# Patient Record
Sex: Male | Born: 1970 | Race: Black or African American | Hispanic: No | Smoking: Never smoker
Health system: Southern US, Community
[De-identification: ages and names within clinical notes are randomized; demographics above are authoritative.]

## PROBLEM LIST (undated history)

## (undated) DIAGNOSIS — G4730 Sleep apnea, unspecified: Secondary | ICD-10-CM

## (undated) DIAGNOSIS — R6250 Unspecified lack of expected normal physiological development in childhood: Secondary | ICD-10-CM

## (undated) DIAGNOSIS — R569 Unspecified convulsions: Secondary | ICD-10-CM

## (undated) DIAGNOSIS — M1710 Unilateral primary osteoarthritis, unspecified knee: Secondary | ICD-10-CM

## (undated) DIAGNOSIS — I10 Essential (primary) hypertension: Secondary | ICD-10-CM

## (undated) DIAGNOSIS — M179 Osteoarthritis of knee, unspecified: Secondary | ICD-10-CM

## (undated) HISTORY — DX: Essential (primary) hypertension: I10

## (undated) HISTORY — DX: Unspecified convulsions: R56.9

---

## 1999-11-03 HISTORY — PX: KNEE ARTHROSCOPY W/ DEBRIDEMENT: SHX1867

## 2001-02-10 ENCOUNTER — Encounter (HOSPITAL_COMMUNITY): Admission: RE | Admit: 2001-02-10 | Discharge: 2001-03-12 | Payer: Self-pay | Admitting: Rheumatology

## 2015-02-25 ENCOUNTER — Other Ambulatory Visit (HOSPITAL_COMMUNITY): Payer: Self-pay | Admitting: Orthopaedic Surgery

## 2015-03-05 NOTE — Pre-Procedure Instructions (Signed)
Johnny BolusMorris F Hayes  03/05/2015   Your procedure is scheduled on:    Wednesday 03/20/15  [facility] at [facility]30 AM.  Call this number if you have problems the morning of surgery: [phone]   Remember:   Do not eat food or drink liquids after midnight.   Take these medicines the morning of surgery with A SIP OF WATER: tylenol   Do not wear jewelry.  Do not wear lotions, powders, orcolognes. You may wear deodorant.  Men may shave face and neck.  Do not bring valuables to the hospital.  [facility] not [facility]  for any belongings or valuables.               Contacts, dentures or bridgework may not be worn into surgery.  Leave suitcase in the car. After surgery it may be brought to your room.  For patients admitted to the hospital, discharge time is determined by your treatment team.               Patients discharged the day of surgery will not be allowed to drive home.  Name and phone number of your driver: family/friend   Special Instructions: [place] - Preparing for Surgery   Please read over the following fact sheets that you were given: Pain Booklet, Coughing and Deep Breathing, MRSA Information and Surgical Site Infection Prevention

## 2015-03-06 ENCOUNTER — Ambulatory Visit (HOSPITAL_COMMUNITY)
Admission: RE | Admit: 2015-03-06 | Discharge: 2015-03-06 | Disposition: A | Discharge status: Home / Self Care | Payer: Medicare Other | Source: Ambulatory Visit | Attending: Orthopaedic Surgery | Admitting: Orthopaedic Surgery

## 2015-03-06 ENCOUNTER — Encounter (HOSPITAL_COMMUNITY): Payer: Self-pay

## 2015-03-06 ENCOUNTER — Encounter (HOSPITAL_COMMUNITY)
Admission: RE | Admit: 2015-03-06 | Discharge: 2015-03-06 | Disposition: A | Discharge status: Home / Self Care | Payer: Medicare Other | Source: Ambulatory Visit | Attending: Orthopaedic Surgery | Admitting: Orthopaedic Surgery

## 2015-03-06 DIAGNOSIS — I4510 Unspecified right bundle-branch block: Secondary | ICD-10-CM | POA: Insufficient documentation

## 2015-03-06 DIAGNOSIS — Z01812 Encounter for preprocedural laboratory examination: Secondary | ICD-10-CM | POA: Insufficient documentation

## 2015-03-06 DIAGNOSIS — G4733 Obstructive sleep apnea (adult) (pediatric): Secondary | ICD-10-CM | POA: Diagnosis not present

## 2015-03-06 DIAGNOSIS — I517 Cardiomegaly: Secondary | ICD-10-CM | POA: Insufficient documentation

## 2015-03-06 DIAGNOSIS — R6250 Unspecified lack of expected normal physiological development in childhood: Secondary | ICD-10-CM | POA: Diagnosis not present

## 2015-03-06 DIAGNOSIS — M1710 Unilateral primary osteoarthritis, unspecified knee: Secondary | ICD-10-CM

## 2015-03-06 DIAGNOSIS — F17210 Nicotine dependence, cigarettes, uncomplicated: Secondary | ICD-10-CM | POA: Diagnosis not present

## 2015-03-06 DIAGNOSIS — Z01818 Encounter for other preprocedural examination: Secondary | ICD-10-CM | POA: Insufficient documentation

## 2015-03-06 DIAGNOSIS — M179 Osteoarthritis of knee, unspecified: Secondary | ICD-10-CM | POA: Insufficient documentation

## 2015-03-06 DIAGNOSIS — Z01810 Encounter for preprocedural cardiovascular examination: Secondary | ICD-10-CM | POA: Diagnosis not present

## 2015-03-06 HISTORY — DX: Osteoarthritis of knee, unspecified: M17.9

## 2015-03-06 HISTORY — DX: Unilateral primary osteoarthritis, unspecified knee: M17.10

## 2015-03-06 HISTORY — DX: Unspecified lack of expected normal physiological development in childhood: R62.50

## 2015-03-06 HISTORY — DX: Sleep apnea, unspecified: G47.30

## 2015-03-06 LAB — COMPREHENSIVE METABOLIC PANEL
ALK PHOS: 109 U/L (ref 38–126)
ALT: 29 U/L (ref 17–63)
ANION GAP: 12 (ref 5–15)
AST: 44 U/L — ABNORMAL HIGH (ref 15–41)
Albumin: 4 g/dL (ref 3.5–5.0)
BUN: 16 mg/dL (ref 6–20)
CO2: 22 mmol/L (ref 22–32)
Calcium: 9.1 mg/dL (ref 8.9–10.3)
Chloride: 106 mmol/L (ref 101–111)
Creatinine, Ser: 1.2 mg/dL (ref 0.61–1.24)
GLUCOSE: 128 mg/dL — AB (ref 70–99)
POTASSIUM: 4.1 mmol/L (ref 3.5–5.1)
SODIUM: 140 mmol/L (ref 135–145)
Total Bilirubin: 0.5 mg/dL (ref 0.3–1.2)
Total Protein: 7.2 g/dL (ref 6.5–8.1)

## 2015-03-06 LAB — CBC
HCT: 48.3 % (ref 39.0–52.0)
Hemoglobin: 16.3 g/dL (ref 13.0–17.0)
MCH: 30.4 pg (ref 26.0–34.0)
MCHC: 33.7 g/dL (ref 30.0–36.0)
MCV: 89.9 fL (ref 78.0–100.0)
PLATELETS: 244 10*3/uL (ref 150–400)
RBC: 5.37 MIL/uL (ref 4.22–5.81)
RDW: 13.1 % (ref 11.5–15.5)
WBC: 8.1 10*3/uL (ref 4.0–10.5)

## 2015-03-06 LAB — URINALYSIS, ROUTINE W REFLEX MICROSCOPIC
Bilirubin Urine: NEGATIVE
GLUCOSE, UA: NEGATIVE mg/dL
HGB URINE DIPSTICK: NEGATIVE
Ketones, ur: NEGATIVE mg/dL
Leukocytes, UA: NEGATIVE
Nitrite: NEGATIVE
PROTEIN: NEGATIVE mg/dL
Specific Gravity, Urine: 1.029 (ref 1.005–1.030)
Urobilinogen, UA: 2 mg/dL — ABNORMAL HIGH (ref 0.0–1.0)
pH: 6 (ref 5.0–8.0)

## 2015-03-06 LAB — PROTIME-INR
INR: 0.95 (ref 0.00–1.49)
Prothrombin Time: 12.8 seconds (ref 11.6–15.2)

## 2015-03-06 LAB — SURGICAL PCR SCREEN
MRSA, PCR: NEGATIVE
Staphylococcus aureus: NEGATIVE

## 2015-03-06 MED ORDER — CHLORHEXIDINE GLUCONATE 4 % EX LIQD: 60.0000 mL | Freq: Once | CUTANEOUS | Status: DC

## 2015-03-07 NOTE — Progress Notes (Addendum)
Anesthesia Chart Review:  Pt is 44 year old male scheduled for computer assisted L total knee arthroplasty on 03/20/2015 with Dr. Ophelia CharterYates.   PMH includes: OSA, developmental delay. Never smoker. BMI 41.   Chest x-ray reviewed.  1. Mild bilateral pulmonary interstitial prominence. These change may be chronic. Active pneumonitis cannot be entirely excluded. 2. Borderline cardiomegaly. No pulmonary venous congestion.  Notified Cheryl in Dr. Ophelia CharterYates' office of CXR results.   EKG: NSR. RBBB.   Pt will need evaluation on DOS by his assigned anesthesiologist.   Rica MastAngela Kabbe, FNP-BC [facility]/Anesthesiology Phone: [phone] 03/07/2015 4:28 PM  Addendum: Dr. Ophelia CharterYates' office referred patient to his PCP Dr. Margo Commonapper to review CXR findings. He evaluated patient on 03/12/15 and patient had no cardiopulmonary symptoms at that time.  He felt felt was "at average risk for proposed surgery, and no additiioanl diagnostic studies are necessary." Clearance note on patient's hard chart.  Velna Ochsllison Rontae Inglett, PA-C [facility]Anesthesiology Phone [phone] 03/15/2015 11:14 AM

## 2015-03-20 ENCOUNTER — Encounter (HOSPITAL_COMMUNITY): Payer: Self-pay | Admitting: Certified Registered Nurse Anesthetist

## 2015-03-20 ENCOUNTER — Inpatient Hospital Stay (HOSPITAL_COMMUNITY)
Admission: RE | Admit: 2015-03-20 | Discharge: 2015-03-23 | DRG: 470 | Disposition: A | Discharge status: Home Health | Payer: Medicare Other | Source: Ambulatory Visit | Attending: Orthopaedic Surgery | Admitting: Orthopaedic Surgery

## 2015-03-20 ENCOUNTER — Inpatient Hospital Stay (HOSPITAL_COMMUNITY): Payer: Medicare Other | Admitting: Vascular Surgery

## 2015-03-20 ENCOUNTER — Encounter (HOSPITAL_COMMUNITY)
Admission: RE | Disposition: A | Discharge status: Home Health | Payer: Self-pay | Source: Ambulatory Visit | Attending: Orthopaedic Surgery

## 2015-03-20 ENCOUNTER — Inpatient Hospital Stay (HOSPITAL_COMMUNITY): Payer: Medicare Other | Admitting: Emergency Medicine

## 2015-03-20 DIAGNOSIS — M1712 Unilateral primary osteoarthritis, left knee: Principal | ICD-10-CM | POA: Diagnosis present

## 2015-03-20 DIAGNOSIS — Z6841 Body Mass Index (BMI) 40.0 and over, adult: Secondary | ICD-10-CM | POA: Diagnosis not present

## 2015-03-20 DIAGNOSIS — F819 Developmental disorder of scholastic skills, unspecified: Secondary | ICD-10-CM | POA: Diagnosis present

## 2015-03-20 DIAGNOSIS — Z880 Allergy status to penicillin: Secondary | ICD-10-CM

## 2015-03-20 DIAGNOSIS — G4730 Sleep apnea, unspecified: Secondary | ICD-10-CM | POA: Diagnosis present

## 2015-03-20 DIAGNOSIS — M25562 Pain in left knee: Secondary | ICD-10-CM | POA: Diagnosis present

## 2015-03-20 DIAGNOSIS — Z96652 Presence of left artificial knee joint: Secondary | ICD-10-CM

## 2015-03-20 DIAGNOSIS — R109 Unspecified abdominal pain: Secondary | ICD-10-CM

## 2015-03-20 HISTORY — PX: KNEE ARTHROPLASTY: SHX992

## 2015-03-20 SURGERY — ARTHROPLASTY, KNEE, TOTAL, USING IMAGELESS COMPUTER-ASSISTED NAVIGATION
Anesthesia: Spinal | Site: Knee | Laterality: Left

## 2015-03-20 MED ORDER — PROPOFOL INFUSION 10 MG/ML OPTIME
INTRAVENOUS | Status: DC | PRN
  Administered 2015-03-20: 15:00:00 via INTRAVENOUS
  Administered 2015-03-20: 50 ug/kg/min via INTRAVENOUS

## 2015-03-20 MED ORDER — PHENYLEPHRINE HCL 10 MG/ML IJ SOLN
INTRAMUSCULAR | Status: DC | PRN
  Administered 2015-03-20 (×2): 80 ug via INTRAVENOUS

## 2015-03-20 MED ORDER — KETAMINE HCL 100 MG/ML IJ SOLN
INTRAMUSCULAR | Status: AC
  Filled 2015-03-20: qty 1

## 2015-03-20 MED ORDER — ALBUMIN HUMAN 5 % IV SOLN
12.5000 g | Freq: Once | INTRAVENOUS | Status: AC
  Administered 2015-03-20: 12.5 g via INTRAVENOUS

## 2015-03-20 MED ORDER — SODIUM CHLORIDE 0.9 % IV SOLN: 500.0000 mg | INTRAVENOUS | Status: DC | PRN

## 2015-03-20 MED ORDER — BUPIVACAINE HCL (PF) 0.25 % IJ SOLN
INTRAMUSCULAR | Status: AC
  Filled 2015-03-20: qty 30

## 2015-03-20 MED ORDER — LACTATED RINGERS IV SOLN
INTRAVENOUS | Status: DC
  Administered 2015-03-20 (×2): via INTRAVENOUS

## 2015-03-20 MED ORDER — HYDROMORPHONE HCL 1 MG/ML IJ SOLN
INTRAMUSCULAR | Status: AC
  Filled 2015-03-20: qty 1

## 2015-03-20 MED ORDER — PROPOFOL 10 MG/ML IV BOLUS
INTRAVENOUS | Status: AC
  Filled 2015-03-20: qty 20

## 2015-03-20 MED ORDER — ACETAMINOPHEN 325 MG PO TABS: 650.0000 mg | ORAL_TABLET | Freq: Four times a day (QID) | ORAL | Status: DC | PRN

## 2015-03-20 MED ORDER — SODIUM CHLORIDE 0.9 % IV SOLN
10.0000 mg | INTRAVENOUS | Status: DC | PRN
  Administered 2015-03-20: 10 ug/min via INTRAVENOUS

## 2015-03-20 MED ORDER — SODIUM CHLORIDE 0.9 % IR SOLN
Status: DC | PRN
  Administered 2015-03-20: 1000 mL

## 2015-03-20 MED ORDER — METHOCARBAMOL 500 MG PO TABS
500.0000 mg | ORAL_TABLET | Freq: Four times a day (QID) | ORAL | Status: DC | PRN
  Administered 2015-03-20 – 2015-03-23 (×6): 500 mg via ORAL
  Filled 2015-03-20 (×7): qty 1

## 2015-03-20 MED ORDER — FENTANYL CITRATE (PF) 100 MCG/2ML IJ SOLN
INTRAMUSCULAR | Status: DC | PRN
  Administered 2015-03-20: 50 ug via INTRAVENOUS

## 2015-03-20 MED ORDER — ASPIRIN EC 325 MG PO TBEC
325.0000 mg | DELAYED_RELEASE_TABLET | Freq: Every day | ORAL | Status: DC
  Administered 2015-03-21 – 2015-03-23 (×3): 325 mg via ORAL
  Filled 2015-03-20 (×3): qty 1

## 2015-03-20 MED ORDER — ALBUMIN HUMAN 5 % IV SOLN
INTRAVENOUS | Status: AC
  Administered 2015-03-20: 12.5 g via INTRAVENOUS
  Filled 2015-03-20: qty 250

## 2015-03-20 MED ORDER — BUPIVACAINE IN DEXTROSE 0.75-8.25 % IT SOLN
INTRATHECAL | Status: DC | PRN
  Administered 2015-03-20: 15 mg via INTRATHECAL

## 2015-03-20 MED ORDER — METHOCARBAMOL 500 MG PO TABS
ORAL_TABLET | ORAL | Status: AC
  Filled 2015-03-20: qty 1

## 2015-03-20 MED ORDER — PHENOL 1.4 % MT LIQD: 1.0000 | OROMUCOSAL | Status: DC | PRN

## 2015-03-20 MED ORDER — KETAMINE HCL 100 MG/ML IJ SOLN: INTRAMUSCULAR | Status: DC | PRN

## 2015-03-20 MED ORDER — PROPOFOL 10 MG/ML IV BOLUS
INTRAVENOUS | Status: DC | PRN
  Administered 2015-03-20: 30 mg via INTRAVENOUS
  Administered 2015-03-20: 150 mg via INTRAVENOUS
  Administered 2015-03-20 (×2): 20 mg via INTRAVENOUS

## 2015-03-20 MED ORDER — ACETAMINOPHEN 650 MG RE SUPP: 650.0000 mg | Freq: Four times a day (QID) | RECTAL | Status: DC | PRN

## 2015-03-20 MED ORDER — DOCUSATE SODIUM 100 MG PO CAPS
100.0000 mg | ORAL_CAPSULE | Freq: Two times a day (BID) | ORAL | Status: DC
  Administered 2015-03-20 – 2015-03-23 (×6): 100 mg via ORAL
  Filled 2015-03-20 (×5): qty 1

## 2015-03-20 MED ORDER — HYDROMORPHONE HCL 1 MG/ML IJ SOLN
1.0000 mg | INTRAMUSCULAR | Status: DC | PRN
  Administered 2015-03-20 – 2015-03-21 (×4): 1 mg via INTRAVENOUS
  Filled 2015-03-20 (×4): qty 1

## 2015-03-20 MED ORDER — ONDANSETRON HCL 4 MG/2ML IJ SOLN
INTRAMUSCULAR | Status: AC
  Filled 2015-03-20: qty 2

## 2015-03-20 MED ORDER — DEXTROSE 5 % IV SOLN
3.0000 g | INTRAVENOUS | Status: AC
  Administered 2015-03-20: 3 g via INTRAVENOUS
  Filled 2015-03-20 (×2): qty 3000

## 2015-03-20 MED ORDER — GLYCOPYRROLATE 0.2 MG/ML IJ SOLN
INTRAMUSCULAR | Status: DC | PRN
  Administered 2015-03-20: 0.2 mg via INTRAVENOUS

## 2015-03-20 MED ORDER — BISACODYL 10 MG RE SUPP: 10.0000 mg | Freq: Every day | RECTAL | Status: DC | PRN

## 2015-03-20 MED ORDER — METOCLOPRAMIDE HCL 5 MG/ML IJ SOLN: 5.0000 mg | Freq: Three times a day (TID) | INTRAMUSCULAR | Status: DC | PRN

## 2015-03-20 MED ORDER — MIDAZOLAM HCL 2 MG/2ML IJ SOLN
INTRAMUSCULAR | Status: AC
  Filled 2015-03-20: qty 2

## 2015-03-20 MED ORDER — HYDROMORPHONE HCL 1 MG/ML IJ SOLN
0.2500 mg | INTRAMUSCULAR | Status: DC | PRN
  Administered 2015-03-20: 0.5 mg via INTRAVENOUS

## 2015-03-20 MED ORDER — OXYCODONE HCL 5 MG PO TABS
ORAL_TABLET | ORAL | Status: AC
  Filled 2015-03-20: qty 2

## 2015-03-20 MED ORDER — ONDANSETRON HCL 4 MG/2ML IJ SOLN
INTRAMUSCULAR | Status: DC | PRN
  Administered 2015-03-20: 4 mg via INTRAVENOUS

## 2015-03-20 MED ORDER — LIDOCAINE HCL (CARDIAC) 20 MG/ML IV SOLN
INTRAVENOUS | Status: DC | PRN
  Administered 2015-03-20: 100 mg via INTRAVENOUS

## 2015-03-20 MED ORDER — METOCLOPRAMIDE HCL 5 MG PO TABS: 5.0000 mg | ORAL_TABLET | Freq: Three times a day (TID) | ORAL | Status: DC | PRN

## 2015-03-20 MED ORDER — KETAMINE HCL 100 MG/ML IJ SOLN
INTRAMUSCULAR | Status: DC | PRN
  Administered 2015-03-20: 200 mg via INTRAVENOUS

## 2015-03-20 MED ORDER — MIDAZOLAM HCL 5 MG/5ML IJ SOLN
INTRAMUSCULAR | Status: DC | PRN
  Administered 2015-03-20: 2 mg via INTRAVENOUS

## 2015-03-20 MED ORDER — SUCCINYLCHOLINE CHLORIDE 20 MG/ML IJ SOLN
INTRAMUSCULAR | Status: AC
  Filled 2015-03-20: qty 1

## 2015-03-20 MED ORDER — METHOCARBAMOL 1000 MG/10ML IJ SOLN
500.0000 mg | Freq: Four times a day (QID) | INTRAVENOUS | Status: DC | PRN
  Filled 2015-03-20: qty 5

## 2015-03-20 MED ORDER — MENTHOL 3 MG MT LOZG: 1.0000 | LOZENGE | OROMUCOSAL | Status: DC | PRN

## 2015-03-20 MED ORDER — ONDANSETRON HCL 4 MG/2ML IJ SOLN: 4.0000 mg | Freq: Four times a day (QID) | INTRAMUSCULAR | Status: DC | PRN

## 2015-03-20 MED ORDER — POTASSIUM CHLORIDE IN NACL 20-0.45 MEQ/L-% IV SOLN
INTRAVENOUS | Status: DC
  Administered 2015-03-20 – 2015-03-21 (×3): via INTRAVENOUS
  Filled 2015-03-20 (×9): qty 1000

## 2015-03-20 MED ORDER — ONDANSETRON HCL 4 MG PO TABS: 4.0000 mg | ORAL_TABLET | Freq: Four times a day (QID) | ORAL | Status: DC | PRN

## 2015-03-20 MED ORDER — BUPIVACAINE HCL (PF) 0.25 % IJ SOLN: INTRAMUSCULAR | Status: DC | PRN

## 2015-03-20 MED ORDER — FENTANYL CITRATE (PF) 250 MCG/5ML IJ SOLN
INTRAMUSCULAR | Status: AC
  Filled 2015-03-20: qty 5

## 2015-03-20 MED ORDER — OXYCODONE HCL 5 MG PO TABS
10.0000 mg | ORAL_TABLET | ORAL | Status: DC | PRN
  Administered 2015-03-20 – 2015-03-23 (×13): 10 mg via ORAL
  Filled 2015-03-20 (×14): qty 2

## 2015-03-20 SURGICAL SUPPLY — 63 items
BANDAGE ELASTIC 4 VELCRO ST LF (GAUZE/BANDAGES/DRESSINGS) ×3 IMPLANT
BANDAGE ELASTIC 6 VELCRO ST LF (GAUZE/BANDAGES/DRESSINGS) ×3 IMPLANT
BANDAGE ESMARK 6X9 LF (GAUZE/BANDAGES/DRESSINGS) ×1 IMPLANT
BENZOIN TINCTURE PRP APPL 2/3 (GAUZE/BANDAGES/DRESSINGS) ×3 IMPLANT
BLADE SAGITTAL 25.0X1.19X90 (BLADE) ×2 IMPLANT
BLADE SAGITTAL 25.0X1.19X90MM (BLADE) ×1
BLADE SAW SGTL 13X75X1.27 (BLADE) ×3 IMPLANT
BNDG ELASTIC 6X10 VLCR STRL LF (GAUZE/BANDAGES/DRESSINGS) ×3 IMPLANT
BNDG ESMARK 6X9 LF (GAUZE/BANDAGES/DRESSINGS) ×3
BOWL SMART MIX CTS (DISPOSABLE) ×3 IMPLANT
CAP KNEE TOTAL 3 SIGMA ×3 IMPLANT
CATH ROBINSON RED A/P 16FR (CATHETERS) ×3 IMPLANT
CEMENT HV SMART SET (Cement) ×6 IMPLANT
CLOSURE STERI-STRIP 1/2X4 (GAUZE/BANDAGES/DRESSINGS) ×1
CLSR STERI-STRIP ANTIMIC 1/2X4 (GAUZE/BANDAGES/DRESSINGS) ×2 IMPLANT
COVER SURGICAL LIGHT HANDLE (MISCELLANEOUS) ×3 IMPLANT
CUFF TOURNIQUET SINGLE 34IN LL (TOURNIQUET CUFF) IMPLANT
CUFF TOURNIQUET SINGLE 44IN (TOURNIQUET CUFF) ×3 IMPLANT
DRAPE ORTHO SPLIT 77X108 STRL (DRAPES) ×4
DRAPE SURG ORHT 6 SPLT 77X108 (DRAPES) ×2 IMPLANT
DRAPE U-SHAPE 47X51 STRL (DRAPES) ×3 IMPLANT
DRSG PAD ABDOMINAL 8X10 ST (GAUZE/BANDAGES/DRESSINGS) ×3 IMPLANT
DURAPREP 26ML APPLICATOR (WOUND CARE) ×3 IMPLANT
ELECT REM PT RETURN 9FT ADLT (ELECTROSURGICAL) ×3
ELECTRODE REM PT RTRN 9FT ADLT (ELECTROSURGICAL) ×1 IMPLANT
FACESHIELD WRAPAROUND (MASK) ×6 IMPLANT
GAUZE SPONGE 4X4 12PLY STRL (GAUZE/BANDAGES/DRESSINGS) ×3 IMPLANT
GAUZE XEROFORM 5X9 LF (GAUZE/BANDAGES/DRESSINGS) ×3 IMPLANT
GLOVE BIOGEL PI IND STRL 8 (GLOVE) ×2 IMPLANT
GLOVE BIOGEL PI INDICATOR 8 (GLOVE) ×4
GLOVE ORTHO TXT STRL SZ7.5 (GLOVE) ×6 IMPLANT
GOWN STRL REUS W/ TWL LRG LVL3 (GOWN DISPOSABLE) ×1 IMPLANT
GOWN STRL REUS W/ TWL XL LVL3 (GOWN DISPOSABLE) ×1 IMPLANT
GOWN STRL REUS W/TWL 2XL LVL3 (GOWN DISPOSABLE) ×3 IMPLANT
GOWN STRL REUS W/TWL LRG LVL3 (GOWN DISPOSABLE) ×2
GOWN STRL REUS W/TWL XL LVL3 (GOWN DISPOSABLE) ×2
HANDPIECE INTERPULSE COAX TIP (DISPOSABLE) ×2
IMMOBILIZER KNEE 22 UNIV (SOFTGOODS) ×3 IMPLANT
KIT BASIN OR (CUSTOM PROCEDURE TRAY) ×3 IMPLANT
KIT ROOM TURNOVER OR (KITS) ×3 IMPLANT
MANIFOLD NEPTUNE II (INSTRUMENTS) ×3 IMPLANT
MARKER SPHERE PSV REFLC THRD 5 (MARKER) ×9 IMPLANT
NEEDLE HYPO 25GX1X1/2 BEV (NEEDLE) ×3 IMPLANT
NS IRRIG 1000ML POUR BTL (IV SOLUTION) ×3 IMPLANT
PACK TOTAL JOINT (CUSTOM PROCEDURE TRAY) ×3 IMPLANT
PACK UNIVERSAL I (CUSTOM PROCEDURE TRAY) ×3 IMPLANT
PAD ARMBOARD 7.5X6 YLW CONV (MISCELLANEOUS) ×6 IMPLANT
PAD CAST 4YDX4 CTTN HI CHSV (CAST SUPPLIES) ×1 IMPLANT
PADDING CAST COTTON 4X4 STRL (CAST SUPPLIES) ×2
PADDING CAST COTTON 6X4 STRL (CAST SUPPLIES) ×3 IMPLANT
PIN SCHANZ 4MM 130MM (PIN) ×12 IMPLANT
SET HNDPC FAN SPRY TIP SCT (DISPOSABLE) ×1 IMPLANT
STAPLER VISISTAT 35W (STAPLE) IMPLANT
SUCTION FRAZIER TIP 10 FR DISP (SUCTIONS) ×3 IMPLANT
SUT VIC AB 1 CTX 36 (SUTURE) ×4
SUT VIC AB 1 CTX36XBRD ANBCTR (SUTURE) ×2 IMPLANT
SUT VIC AB 2-0 CT1 27 (SUTURE) ×4
SUT VIC AB 2-0 CT1 TAPERPNT 27 (SUTURE) ×2 IMPLANT
SUT VIC AB 3-0 X1 27 (SUTURE) ×6 IMPLANT
SYR CONTROL 10ML LL (SYRINGE) ×3 IMPLANT
TOWEL OR 17X24 6PK STRL BLUE (TOWEL DISPOSABLE) ×6 IMPLANT
TOWEL OR 17X26 10 PK STRL BLUE (TOWEL DISPOSABLE) ×3 IMPLANT
WATER STERILE IRR 1000ML POUR (IV SOLUTION) ×3 IMPLANT

## 2015-03-20 NOTE — Anesthesia Procedure Notes (Addendum)
Procedure Name: LMA Insertion Date/Time: 03/20/2015 1:02 PM Performed by: Bishop LimboARTER, JENNIFER S Pre-anesthesia Checklist: Patient identified, Emergency Drugs available, Suction available, Patient being monitored and Timeout performed Patient Re-evaluated:Patient Re-evaluated prior to inductionOxygen Delivery Method: Circle system utilized Preoxygenation: Pre-oxygenation with 100% oxygen Intubation Type: IV induction LMA: LMA inserted LMA Size: 5.0 Number of attempts: 1 Placement Confirmation: positive ETCO2,  breath sounds checked- equal and bilateral and CO2 detector Tube secured with: Tape Dental Injury: Teeth and Oropharynx as per pre-operative assessment    Spinal Patient location during procedure: OR Start time: 03/20/2015 12:32 PM End time: 03/20/2015 12:40 PM Staffing Anesthesiologist: Gaynelle AduFITZGERALD, Kenitra Leventhal Performed by: anesthesiologist  Preanesthetic Checklist Completed: patient identified, surgical consent, pre-op evaluation, timeout performed, IV checked, risks and benefits discussed and monitors and equipment checked Spinal Block Patient position: sitting Prep: Betadine Patient monitoring: cardiac monitor, continuous pulse ox and blood pressure Approach: midline Location: L3-4 Injection technique: single-shot Needle Needle type: Pencan  Needle gauge: 24 G Needle length: 9 cm Assessment Sensory level: T8 Additional Notes Functioning IV was confirmed and monitors were applied. Sterile prep and drape, including hand hygiene and sterile gloves were used. The patient was positioned and the spine was prepped. The skin was anesthetized with lidocaine.  Free flow of clear CSF was obtained prior to injecting local anesthetic into the CSF.  The spinal needle aspirated freely following injection.  The needle was carefully withdrawn.  The patient tolerated the procedure well.

## 2015-03-20 NOTE — Brief Op Note (Signed)
03/20/2015  4:27 PM  PATIENT:  Johnny Hayes  44 y.o. male  PRE-OPERATIVE DIAGNOSIS:  Left Knee Osteoarthritis   POST-OPERATIVE DIAGNOSIS:  Left Knee Osteoarthritis   PROCEDURE:  Procedure(s): COMPUTER ASSISTED TOTAL KNEE ARTHROPLASTY (Left)  SURGEON:  Surgeon(s) and Role:    Eldred Manges* Mark C Yates, MD - Primary  PHYSICIAN ASSISTANT: Virga Haltiwanger m. Barry Dieneswens pa-c     ANESTHESIA:   spinal  EBL:  Total I/O In: 1750 [I.V.:1750] Out: 600 [Urine:300; Blood:300]  BLOOD ADMINISTERED:none  DRAINS: none    SPECIMEN:  No Specimen  DISPOSITION OF SPECIMEN:  N/A  COUNTS:  YES  TOURNIQUET:   Total Tourniquet Time Documented: Thigh (Left) - 86 minutes Total: Thigh (Left) - 86 minutes     PATIENT DISPOSITION:  PACU - hemodynamically stable.

## 2015-03-20 NOTE — Anesthesia Postprocedure Evaluation (Signed)
  Anesthesia Post-op Note  Patient: Johnny Hayes  Procedure(s) Performed: Procedure(s): COMPUTER ASSISTED TOTAL KNEE ARTHROPLASTY (Left)  Patient Location: PACU  Anesthesia Type:MAC and Spinal  Level of Consciousness: awake, alert  and oriented  Airway and Oxygen Therapy: Patient Spontanous Breathing and Patient connected to nasal cannula oxygen  Post-op Pain: mild  Post-op Assessment: Post-op Vital signs reviewed, Patient's Cardiovascular Status Stable, Respiratory Function Stable, Patent Airway and Pain level controlled  Post-op Vital Signs: stable  Last Vitals:  Filed Vitals:   03/20/15 1807  BP:   Pulse: 95  Temp:   Resp: 16    Complications: No apparent anesthesia complications

## 2015-03-20 NOTE — Progress Notes (Signed)
Notified Dr. Ophelia CharterYates of pt's allery to PCN. Stated the order for ancef still stands.

## 2015-03-20 NOTE — H&P (Signed)
TOTAL KNEE ADMISSION H&P  Patient is being admitted for left total knee arthroplasty.  Subjective:  Chief Complaint:left knee pain.  HPI: Johnny Hayes, 44 y.o. male, has a history of pain and functional disability in the left knee due to arthritis and has failed non-surgical conservative treatments for greater than 12 weeks to includeNSAID's and/or analgesics, viscosupplementation injections, use of assistive devices and activity modification.  Onset of symptoms was gradual, starting 5 years ago with gradually worsening course since that time.  on the left knee(s).  Patient currently rates pain in the left knee(s) at 10 out of 10 with activity. Patient has night pain, worsening of pain with activity and weight bearing, pain that interferes with activities of daily living, pain with passive range of motion, crepitus and joint swelling.  Patient has evidence of subchondral cysts, subchondral sclerosis, periarticular osteophytes and joint space narrowing by imaging studies. . There is no active infection.  There are no active problems to display for this patient.  Past Medical History  Diagnosis Date  . Development delay     "no longer takes depakote"  . Osteoarthritis of knee     left  . Sleep apnea     Past Surgical History  Procedure Laterality Date  . Knee arthroscopy w/ debridement Left 2001    No prescriptions prior to admission   Allergies  Allergen Reactions  . Penicillins Hives    History  Substance Use Topics  . Smoking status: Never Smoker   . Smokeless tobacco: Not on file  . Alcohol Use: No    No family history on file.   ROS  Objective:  Physical Exam  Vital signs in last 24 hours:    Labs:   There is no height or weight on file to calculate BMI.   Imaging Review Plain radiographs demonstrate moderate degenerative joint disease of the left knee(s). The overall alignment ismild varus. The bone quality appears to be good for age and reported activity  level.  Assessment/Plan:  End stage arthritis, left knee   The patient history, physical examination, clinical judgment of the provider and imaging studies are consistent with end stage degenerative joint disease of the left knee(s) and total knee arthroplasty is deemed medically necessary. The treatment options including medical management, injection therapy arthroscopy and arthroplasty were discussed at length. The risks and benefits of total knee arthroplasty were presented and reviewed. The risks due to aseptic loosening, infection, stiffness, patella tracking problems, thromboembolic complications and other imponderables were discussed. The patient acknowledged the explanation, agreed to proceed with the plan and consent was signed. Patient is being admitted for inpatient treatment for surgery, pain control, PT, OT, prophylactic antibiotics, VTE prophylaxis, progressive ambulation and ADL's and discharge planning. The patient is planning to be discharged home with home health services

## 2015-03-20 NOTE — Interval H&P Note (Signed)
History and Physical Interval Note:  03/20/2015 12:23 PM  Johnny Hayes  has presented today for surgery, with the diagnosis of Left Knee Osteoarthritis   The various methods of treatment have been discussed with the patient and family. After consideration of risks, benefits and other options for treatment, the patient has consented to  Procedure(s): COMPUTER ASSISTED TOTAL KNEE ARTHROPLASTY (Left) as a surgical intervention .  The patient's history has been reviewed, patient examined, no change in status, stable for surgery.  I have reviewed the patient's chart and labs.  Questions were answered to the patient's satisfaction.  Patient has primary osteoarthritis .    YATES,MARK C

## 2015-03-20 NOTE — Anesthesia Preprocedure Evaluation (Addendum)
Anesthesia Evaluation  Patient identified by MRN, date of birth, ID band Patient awake    Reviewed: Allergy & Precautions, H&P , NPO status , Patient's Chart, lab work & pertinent test results  Airway Mallampati: II  TM Distance: >3 FB Neck ROM: Full    Dental no notable dental hx. (+) Edentulous Upper, Dental Advisory Given   Pulmonary sleep apnea and Continuous Positive Airway Pressure Ventilation ,  breath sounds clear to auscultation  Pulmonary exam normal       Cardiovascular negative cardio ROS  Rhythm:Regular Rate:Normal     Neuro/Psych negative neurological ROS  negative psych ROS   GI/Hepatic negative GI ROS, Neg liver ROS,   Endo/Other  Morbid obesity  Renal/GU negative Renal ROS  negative genitourinary   Musculoskeletal  (+) Arthritis -, Osteoarthritis,    Abdominal   Peds  Hematology negative hematology ROS (+)   Anesthesia Other Findings   Reproductive/Obstetrics negative OB ROS                           Anesthesia Physical Anesthesia Plan  ASA: III  Anesthesia Plan: Spinal   Post-op Pain Management:    Induction: Intravenous  Airway Management Planned: Simple Face Mask  Additional Equipment:   Intra-op Plan:   Post-operative Plan:   Informed Consent: I have reviewed the patients History and Physical, chart, labs and discussed the procedure including the risks, benefits and alternatives for the proposed anesthesia with the patient or authorized representative who has indicated his/her understanding and acceptance.   Dental advisory given  Plan Discussed with: CRNA  Anesthesia Plan Comments:        Anesthesia Quick Evaluation

## 2015-03-20 NOTE — Transfer of Care (Signed)
Immediate Anesthesia Transfer of Care Note  Patient: Johnny Hayes  Procedure(s) Performed: Procedure(s): COMPUTER ASSISTED TOTAL KNEE ARTHROPLASTY (Left)  Patient Location: PACU  Anesthesia Type:General and Spinal  Level of Consciousness: sedated and lethargic  Airway & Oxygen Therapy: Patient Spontanous Breathing, Patient connected to face mask and requiring chin lift and jaw thrust to maintain sPO2  Post-op Assessment: Report given to RN, Post -op Vital signs reviewed and stable and lifts head.  does not yet open eyes  Post vital signs: Reviewed and unstable  Last Vitals:  Filed Vitals:   03/20/15 1117  BP: 154/100  Pulse:   Temp:   Resp:     Complications: Dr. Aleene DavidsonE. Fitzgerald consulted re:  hypotension and LOC, sPO2.

## 2015-03-21 ENCOUNTER — Encounter (HOSPITAL_COMMUNITY): Payer: Self-pay | Admitting: Orthopaedic Surgery

## 2015-03-21 LAB — CBC
HEMATOCRIT: 39.8 % (ref 39.0–52.0)
Hemoglobin: 12.6 g/dL — ABNORMAL LOW (ref 13.0–17.0)
MCH: 29 pg (ref 26.0–34.0)
MCHC: 31.7 g/dL (ref 30.0–36.0)
MCV: 91.7 fL (ref 78.0–100.0)
PLATELETS: 205 10*3/uL (ref 150–400)
RBC: 4.34 MIL/uL (ref 4.22–5.81)
RDW: 12.7 % (ref 11.5–15.5)
WBC: 8.7 10*3/uL (ref 4.0–10.5)

## 2015-03-21 LAB — BASIC METABOLIC PANEL
Anion gap: 6 (ref 5–15)
BUN: 9 mg/dL (ref 6–20)
CALCIUM: 7.8 mg/dL — AB (ref 8.9–10.3)
CO2: 25 mmol/L (ref 22–32)
CREATININE: 1.15 mg/dL (ref 0.61–1.24)
Chloride: 101 mmol/L (ref 101–111)
GFR calc Af Amer: >60 mL/min (ref >60)
GLUCOSE: 110 mg/dL — AB (ref 65–99)
Potassium: 4.4 mmol/L (ref 3.5–5.1)
SODIUM: 132 mmol/L — AB (ref 135–145)

## 2015-03-21 NOTE — Progress Notes (Signed)
Physical Therapy Treatment Patient Details Name: Johnny Hayes MRN: [id number] DOB: 07/11/1971 Today's Date: 03/21/2015    History of Present Illness Patient is a 44 y/o male s/p L TKA. PMH of developmental delay, sleep apnea and osteoarthritis.    PT Comments    Patient progressing slowly with mobility. Continues to require increased time to perform all transfers/mobility due to pain and anticipation of pain. Improved ambulation distance with encouragement. Will need to be able to negotiate steps prior to d/c. Reviewed HEP and encouraged sitting in chair for all meals. Will continue to follow to maximize independence.   Follow Up Recommendations  Home health PT;Supervision/Assistance - 24 hour     Equipment Recommendations  Rolling walker with 5" wheels    Recommendations for Other Services OT consult     Precautions / Restrictions Precautions Precautions: Fall;Knee Precaution Booklet Issued: Yes (comment) Precaution Comments: reviewed no pillow under knee Restrictions Weight Bearing Restrictions: Yes LLE Weight Bearing: Weight bearing as tolerated    Mobility  Bed Mobility Overal bed mobility: Needs Assistance Bed Mobility: Sit to Supine     Sit to supine: Min assist   General bed mobility comments: Min A to bring LLE into bed. pt trying to do it independently however unable.   Transfers Overall transfer level: Needs assistance Equipment used: Rolling walker (2 wheeled) Transfers: Sit to/from Stand Sit to Stand: Mod assist         General transfer comment: Mod A to rise from low recliner surface. Cues for technique and hand placement. INcreased time to perform and encouragement.   Ambulation/Gait Ambulation/Gait assistance: Min assist Ambulation Distance (Feet): 15 Feet Assistive device: Rolling walker (2 wheeled) Gait Pattern/deviations: Step-to pattern;Decreased stance time - left;Decreased step length - right;Trunk flexed;Shuffle;Wide base of support    Gait velocity interpretation: Below normal speed for age/gender General Gait Details: Min A to advance RLE and for balance. Cues for upright posture. Dyspnea present. Very slow gait. Shuffling Bil feet initially progressing to picking up feet with assist with weightshifting.   Stairs            Wheelchair Mobility    Modified Rankin (Stroke Patients Only)       Balance Overall balance assessment: Needs assistance Sitting-balance support: Feet supported;No upper extremity supported Sitting balance-Leahy Scale: Fair     Standing balance support: During functional activity Standing balance-Leahy Scale: Poor Standing balance comment: needs rw for support                    Cognition Arousal/Alertness: Awake/alert Behavior During Therapy: WFL for tasks assessed/performed Overall Cognitive Status: Within Functional Limits for tasks assessed                      Exercises Total Joint Exercises Ankle Circles/Pumps: Both;10 reps;Supine     General Comments General comments (skin integrity, edema, etc.): Pt's friend inr oom during PT evaluation.      Pertinent Vitals/Pain Pain Assessment: Faces Pain Score: 8  Faces Pain Scale: Hurts whole lot Pain Location: left knee with movement Pain Descriptors / Indicators: Grimacing;Guarding;Spasm;Sore Pain Intervention(s): Limited activity within patient's tolerance;Monitored during session;Repositioned;Premedicated before session;PCA encouraged;Ice applied    Home Living   Prior Function    PT Goals (current goals can now be found in the care plan section)   Progress towards PT goals: Progressing toward goals    Frequency  7X/week    PT Plan Current plan remains appropriate    Co-evaluation  End of Session Equipment Utilized During Treatment: Gait belt Activity Tolerance: Patient tolerated treatment well;Patient limited by pain Patient left: in bed;with call bell/phone within  reach;with bed alarm set;with family/visitor present;in CPM     Time: 1433-1501 PT Time Calculation (min) (ACUTE ONLY): 28 min  Charges:  $Gait Training: 8-22 mins $Therapeutic Activity: 8-22 mins                    G Codes:      Penda Venturi A Walid Haig 03/21/2015, 3:21 PM  Mylo RedShauna Leng Montesdeoca, PT, DPT [phone]

## 2015-03-21 NOTE — Evaluation (Signed)
Physical Therapy Evaluation Patient Details Name: Johnny Hayes F Shvartsman MRN: [id number] DOB: 01/29/1971 Today's Date: 03/21/2015   History of Present Illness  Patient is a 44 y/o male s/p L TKA. PMH of developmental delay, sleep apnea and osteoarthritis.  Clinical Impression  Patient presents with pain and post surgical deficits LLE s/p above surgery impacting mobility. Education provided on HEP and precautions. Increased time to perform all mobility secondary to pain. Tolerated short distance ambulation with Min A for balance. Pt will either be going home to stay with friend or mother. Would benefit from skilled PT to improve transfers, gait, balance and mobility so pt can maximize independence and return to PLOF.     Follow Up Recommendations Home health PT;Supervision/Assistance - 24 hour    Equipment Recommendations  Rolling walker with 5" wheels (bariatric RW.)    Recommendations for Other Services OT consult     Precautions / Restrictions Precautions Precautions: Fall;Knee Precaution Booklet Issued: Yes (comment) Precaution Comments: Reviewed no pillow under knee and HEP. Restrictions Weight Bearing Restrictions: Yes LLE Weight Bearing: Weight bearing as tolerated      Mobility  Bed Mobility Overal bed mobility: Needs Assistance Bed Mobility: Supine to Sit     Supine to sit: Min assist;HOB elevated     General bed mobility comments: Min A to bring LLE to EOB. Increased time. Cues for technique. Use of rails.  Transfers Overall transfer level: Needs assistance Equipment used: Rolling walker (2 wheeled) (bari RW) Transfers: Sit to/from Stand Sit to Stand: Mod assist         General transfer comment: Mod A to rise from elevated bed height with cues for hand placement and technique. Increased time to stand due to pain. Increased trunk/hip flexion upon standing. Transferred to chair.  Ambulation/Gait Ambulation/Gait assistance: Min assist Ambulation Distance (Feet): 7  Feet Assistive device: Rolling walker (2 wheeled) Gait Pattern/deviations: Step-to pattern;Decreased stance time - left;Decreased step length - right;Trunk flexed   Gait velocity interpretation: Below normal speed for age/gender General Gait Details: Min A to advance RLE and for balance. Cues for upright posture. Dyspnea present. HR increased to 120 bpm.  Stairs            Wheelchair Mobility    Modified Rankin (Stroke Patients Only)       Balance Overall balance assessment: Needs assistance Sitting-balance support: Feet supported;Bilateral upper extremity supported Sitting balance-Leahy Scale: Fair     Standing balance support: During functional activity Standing balance-Leahy Scale: Poor                               Pertinent Vitals/Pain Pain Assessment: Faces Faces Pain Scale: Hurts whole lot Pain Location: left knee Pain Descriptors / Indicators: Sore;Aching Pain Intervention(s): Limited activity within patient's tolerance;Monitored during session;Repositioned    Home Living Family/patient expects to be discharged to:: Private residence Living Arrangements: Parent;Spouse/significant other (Staying either with significant other or mother. ) Available Help at Discharge: Family;Friend(s);Available 24 hours/day Type of Home: House Home Access: Stairs to enter Entrance Stairs-Rails: None Entrance Stairs-Number of Steps: 1 or 2 depending on which house he is going too Home Layout: One level Home Equipment: None      Prior Function Level of Independence: Independent               Hand Dominance        Extremity/Trunk Assessment   Upper Extremity Assessment: Defer to OT evaluation  Lower Extremity Assessment: LLE deficits/detail;Generalized weakness   LLE Deficits / Details: Limited AROM hip, knee 2/2 pain.     Communication   Communication: No difficulties  Cognition Arousal/Alertness: Awake/alert Behavior During  Therapy: WFL for tasks assessed/performed Overall Cognitive Status: Within Functional Limits for tasks assessed                      General Comments General comments (skin integrity, edema, etc.): Pt's friend inr oom during PT evaluation.    Exercises Total Joint Exercises Ankle Circles/Pumps: Both;10 reps;Supine Quad Sets: Left;5 reps;Supine Knee Flexion: Left;5 reps;Seated Goniometric ROM: -25-70 degrees knee AROM.      Assessment/Plan    PT Assessment Patient needs continued PT services  PT Diagnosis Difficulty walking;Acute pain;Generalized weakness   PT Problem List Decreased strength;Pain;Cardiopulmonary status limiting activity;Decreased range of motion;Impaired sensation;Decreased activity tolerance;Decreased balance;Decreased mobility;Decreased knowledge of use of DME  PT Treatment Interventions Balance training;DME instruction;Gait training;Stair training;Functional mobility training;Therapeutic activities;Therapeutic exercise;Patient/family education   PT Goals (Current goals can be found in the Care Plan section) Acute Rehab PT Goals Patient Stated Goal: to go home PT Goal Formulation: With patient Time For Goal Achievement: 04/04/15 Potential to Achieve Goals: Fair    Frequency 7X/week   Barriers to discharge Inaccessible home environment Has to negotiate 1 or 2 steps without rails to get into home    Co-evaluation               End of Session Equipment Utilized During Treatment: Gait belt Activity Tolerance: Patient limited by pain;Patient tolerated treatment well Patient left: in chair;with call bell/phone within reach;with family/visitor present Nurse Communication: Mobility status         Time: [id number] PT Time Calculation (min) (ACUTE ONLY): 41 min   Charges:   PT Evaluation $Initial PT Evaluation Tier I: 1 Procedure PT Treatments $Therapeutic Exercise: 8-22 mins $Therapeutic Activity: 8-22 mins   PT G Codes:        Letticia Bhattacharyya  A Erek Kowal 03/21/2015, 1:08 PM Mylo RedShauna Kinnedy Mongiello, PT, DPT [phone]

## 2015-03-21 NOTE — Op Note (Signed)
NAMSuann Larry:  Krotz, Cola                ACCOUNT NO.:  [id number]  MEDICAL RECORD NO.:  [id number]  LOCATION:  [id number]                        FACILITY:  [facility]  PHYSICIAN:  Marabella Popiel C. Ophelia CharterYates, M.D.    DATE OF BIRTH:  04-23-1971  DATE OF PROCEDURE:  03/20/2015 DATE OF DISCHARGE:                              OPERATIVE REPORT   PREOPERATIVE DIAGNOSIS:  Left knee osteoarthritis.  POSTOPERATIVE DIAGNOSIS:  Left knee osteoarthritis.  PROCEDURE:  Left total knee arthroplasty, cemented computer assist.  ANESTHESIA:  Spinal with LMA tube placement and sedation.  SURGEON:  Lauraann Missey C. Ophelia CharterYates, M.D.  ASSISTANT:  Genene ChurnJames M. Barry Dieneswens, PA-C, medically necessary and present for the entire procedure.  TOURNIQUET TIME:  An hour and 25 minutes.  DRAINS:  None.  INDICATIONS:  This 360-pound male who has learning disabilities, had progressive osteoarthritis bone-on-bone changes, flexion deformity, progression to the point where he has not been able to walk more than 10 feet.  He was better in the past for about a week with previous intra- articular injections.  He has bone-on-bone changes, primary osteoarthritis, and 14-degree flexion contracture, and 9-degree varus deformity.  DESCRIPTION OF PROCEDURE:  After induction of spinal anesthesia, the patient had some problems to continue to try to move his arms.  He was given some sedation and had some problems with snoring.  Has history of sleep apnea.  LMA tube was placed for his airway and the patient slipped to the case and had good spinal.  Proximal thigh tourniquet was applied. Prepping with DuraPrep, usual extremity sheets, drapes, impervious stockinette, Coban, sterile skin marker, Betadine, Steri-Drape were applied.  Time-out procedure.  3 g Ancef prophylaxis due to the patient's size and weight of 162 kg.  After wrapped in Esmarch, tourniquet was inflated to 400.  Midline incision was made.  Patella was flipped, but would only flip to about 90 degrees.   Patella was cut from facet.  Facet sized for 41 and holes were drilled.  Pins were placed for computer navigation and models were created.  The patient was #5 size on the femur.  There was 1 cm osteophyte, bone-on-bone changes, eburnated bone, 2 cm spurs on the femur as well as on the tibia, which were removed with Leksell rongeur.  11 mm was taken off the femur, 10 off the tibia.  Chamfer cuts were made, trial sizes.  Tibia was 4.  There was good cortical fit for the tray.  Trials were inserted.  There was 1 mm difference with flexion/extension gaps with the medial side being little tighter, and there were some medial meniscal remnants, which were resected.  Posterior spurs were removed off the femur.  Pulse lavage some remaining removal of small pieces of bone.  Vacuum mixing of the cement.  Tibia was inserted first followed by femur, placement of the tray, and then polyethylene 41 mm 3-PEG patella.  There were symmetrical flexion/extension gaps, full extension, computer pins were removed. Cement was hard at 15 minutes.  Tourniquet dropped.  Hemostasis was obtained.  Tourniquet times were longer than normal due to the patient's very large size and extremely heavy legs.  Deep fascia was closed closing the split in the quad  tendon, medial one-third, lateral two-thirds.  2-0 Vicryl in the superficial retinaculum, subcutaneous tissue, subcuticular skin closure.  Tincture of benzoin, Steri-Strips, postop dressing, and knee immobilizer.  The patient tolerated the procedure well.     Daren Yeagle C. Ophelia CharterYates, M.D.     MCY/MEDQ  D:  03/20/2015  T:  03/21/2015  Job:  [id number]

## 2015-03-21 NOTE — Progress Notes (Signed)
Orthopedic Tech Progress Note Patient Details:  Jethro BolusMorris F Moskal 03/01/1971 [id number]  Patient ID: Jethro BolusMorris F Dayley, male   DOB: 03/09/1971, 44 y.o.   MRN: [id number]  Placed pt's lle on cpm @ 0-40 degrees; will increase as pt tolerates; RN notified Nikki DomCrawford, Ryatt Corsino 03/21/2015, 8:42 AM

## 2015-03-21 NOTE — Addendum Note (Signed)
Addendum  created 03/21/15 0850 by Adair LaundryLynn A Vanderbilt Ranieri, CRNA   Modules edited: Anesthesia Medication Administration

## 2015-03-21 NOTE — Progress Notes (Signed)
Subjective: Doing well.  Pain controlled.     Objective: Vital signs in last 24 hours: Temp:  [95.3 F (35.2 C)-98.7 F (37.1 C)] 98.7 F (37.1 C) (05/19 0616) Pulse Rate:  [69-99] 94 (05/19 0616) Resp:  [13-19] 16 (05/19 0313) BP: (82-171)/(47-100) 133/63 mmHg (05/19 0616) SpO2:  [93 %-100 %] 93 % (05/19 0616) Weight:  [162.6 kg (358 lb 7.5 oz)-166.515 kg (367 lb 1.6 oz)] 166.515 kg (367 lb 1.6 oz) (05/18 1830)  Intake/Output from previous day: 05/18 0701 - 05/19 0700 In: 4206.7 [P.O.:120; I.V.:3836.7; IV Piggyback:250] Out: 602 [Urine:302; Blood:300] Intake/Output this shift:    No results for input(s): HGB in the last 72 hours. No results for input(s): WBC, RBC, HCT, PLT in the last 72 hours. No results for input(s): NA, K, CL, CO2, BUN, CREATININE, GLUCOSE, CALCIUM in the last 72 hours. No results for input(s): LABPT, INR in the last 72 hours.  Exam:  Dressing c/d/i.  Calf nontender, NVI.  Alert and oriented.   Assessment/Plan: Saline lock IV.  D/c dilaudid.  PT today.  Anticipate d/c home tomorrow.  Bed to chair with assistance.    Natalya Domzalski M 03/21/2015, 7:40 AM

## 2015-03-21 NOTE — Care Management Note (Signed)
Case Management Note  Patient Details  Name: Jethro BolusMorris F Minus MRN: [id number] Date of Birth: 09/19/1971  Subjective/Objective:    [age] yr old male admitted with osteoarthritis of the left knee. Patient underwent a left total knee arthroplasty.                Action/Plan:  Case Manager spoke with patient concerning Home Health and DME needs. Choice offered. [facility]. Patient states he is not sure if he will go to his home to recover, will notify CM later.  2:00pm Mr. Ophelia CharterClark's mom states that he will be going to her home: [address], [place], [place] [address] her number is [phone]. Patient's cell# [phone]    Expected Discharge Date: 03/22/15                 [facility]    In-House Referral:  NA  Discharge planning Services  CM Consult  Post Acute Care Choice:  Home Health Choice offered to:  Patient  DME Arranged:  3-N-1, Blanche EastWalker tall DME Agency:  [facility]  HH Arranged:  PT HH Agency:  [facility]  Status of Service:  Completed, signed off  Medicare Important Message Given:    Date Medicare IM Given:    Medicare IM give by:    Date Additional Medicare IM Given:    Additional Medicare Important Message give by:     If discussed at Long Length of Stay Meetings, dates discussed:    Additional Comments:  Durenda GuthrieBrady, Olympia Adelsberger Naomi, RN 03/21/2015, 10:55 AM

## 2015-03-21 NOTE — Progress Notes (Signed)
Orthopedic Tech Progress Note Patient Details:  Johnny BolusMorris F Hayes 10/26/1971 [id number]  CPM Left Knee CPM Left Knee: On Left Knee Flexion (Degrees): 60 Left Knee Extension (Degrees): 0 Pt's currrent weight exceeds capacity of trapeze bar patient helper; RN notified  Nikki DomCrawford, Markee Remlinger 03/21/2015, 8:39 AM

## 2015-03-21 NOTE — Procedures (Signed)
Pt placed on CPAP set at 14 cmH2O and with 3L of oxygen.  Pt is comfortable at this time.

## 2015-03-21 NOTE — Evaluation (Signed)
Occupational Therapy Evaluation Patient Details Name: Johnny Hayes MRN: [id number] DOB: 08/18/1971 Today's Date: 03/21/2015    History of Present Illness Patient is a 44 y/o male s/p L TKA. PMH of developmental delay, sleep apnea and osteoarthritis.   Clinical Impression   Pt admitted with the above diagnoses and presents with below problem list. Pt will benefit from continued acute OT to address the below listed deficits and maximize independence with BADLs prior to d/c home with family providing 24/7 assist at d/c. PTA pt was independent with ADLs. Pt currently at mod A level for LB ADLs. Ambulation not attempted this session but likely currently +2.  Limited by 8/10 pain this session. OT to continue to follow acutely.     Follow Up Recommendations  Supervision/Assistance - 24 hour;No OT follow up    Equipment Recommendations  3 in 1 bedside comode    Recommendations for Other Services       Precautions / Restrictions Precautions Precautions: Fall;Knee Precaution Booklet Issued: Yes (comment) Precaution Comments: reviewed Restrictions Weight Bearing Restrictions: Yes LLE Weight Bearing: Weight bearing as tolerated      Mobility Bed Mobility Overal bed mobility: Needs Assistance Bed Mobility: Supine to Sit     Supine to sit: Min assist;HOB elevated     General bed mobility comments: in recliner  Transfers Overall transfer level: Needs assistance Equipment used: Rolling walker (2 wheeled) Transfers: Sit to/from Stand Sit to Stand: Mod assist         General transfer comment: from lower surface of recliner. Pt needing lots of extra time for each phase and max encouragement second to pain. Cues for technique with rw.    Balance Overall balance assessment: Needs assistance Sitting-balance support: Bilateral upper extremity supported;Feet supported Sitting balance-Leahy Scale: Fair     Standing balance support: Bilateral upper extremity supported;During  functional activity Standing balance-Leahy Scale: Poor Standing balance comment: needs rw for support                            ADL Overall ADL's : Needs assistance/impaired Eating/Feeding: Set up;Sitting   Grooming: Set up;Sitting   Upper Body Bathing: Set up;Sitting   Lower Body Bathing: Moderate assistance;With adaptive equipment;Sit to/from stand   Upper Body Dressing : Set up;Sitting   Lower Body Dressing: Moderate assistance;With adaptive equipment;Sit to/from stand                 General ADL Comments: Pt completed sit<>stand from recliner with mod A and lots of extra time. Pt completed sidesteps to right and left. Ambulation not attempted due to pain. Educated pt and parents on strategies and AE for ADLs. discussed home setup and equipment recommendations.     Vision     Perception     Praxis      Pertinent Vitals/Pain Pain Assessment: 0-10 Pain Score: 8  Faces Pain Scale: Hurts whole lot Pain Location: Lt knee Pain Descriptors / Indicators: Aching;Spasm;Sore;Grimacing;Guarding Pain Intervention(s): Limited activity within patient's tolerance;Monitored during session;Repositioned;Premedicated before session;Utilized relaxation techniques;Ice applied     Hand Dominance     Extremity/Trunk Assessment Upper Extremity Assessment Upper Extremity Assessment: Overall WFL for tasks assessed   Lower Extremity Assessment Lower Extremity Assessment: Defer to PT evaluation LLE Deficits / Details: Limited AROM hip, knee 2/2 pain. LLE Sensation: decreased light touch       Communication Communication Communication: No difficulties   Cognition Arousal/Alertness: Awake/alert Behavior During Therapy: WFL for tasks assessed/performed  Overall Cognitive Status: Within Functional Limits for tasks assessed                     General Comments       Exercises       Shoulder Instructions      Home Living Family/patient expects to be  discharged to:: Private residence Living Arrangements: Parent;Spouse/significant other Available Help at Discharge: Family;Friend(s);Available 24 hours/day Type of Home: House Home Access: Stairs to enter Entergy CorporationEntrance Stairs-Number of Steps: 1 or 2 depending on which house he is going too Entrance Stairs-Rails: None Home Layout: One level     Bathroom Shower/Tub: Tub/shower unit;Walk-in shower   Bathroom Toilet: Standard Bathroom Accessibility: Yes How Accessible: Accessible via walker Home Equipment: Hand held shower head   Additional Comments: discussed plan to use walkin shower      Prior Functioning/Environment Level of Independence: Independent             OT Diagnosis: Acute pain   OT Problem List: Impaired balance (sitting and/or standing);Decreased knowledge of use of DME or AE;Decreased knowledge of precautions;Pain;Obesity   OT Treatment/Interventions: Self-care/ADL training;Energy conservation;DME and/or AE instruction;Therapeutic activities;Patient/family education;Balance training    OT Goals(Current goals can be found in the care plan section) Acute Rehab OT Goals Patient Stated Goal: to go home OT Goal Formulation: With patient/family Time For Goal Achievement: 03/28/15 Potential to Achieve Goals: Good ADL Goals Pt Will Perform Lower Body Dressing: with min guard assist;with adaptive equipment;sit to/from stand Pt Will Transfer to Toilet: with min guard assist;ambulating;bedside commode Pt Will Perform Toileting - Clothing Manipulation and hygiene: with min guard assist;with adaptive equipment;sit to/from stand Pt Will Perform Tub/Shower Transfer: with min guard assist;ambulating;3 in 1;rolling walker Additional ADL Goal #1: Pt will complete bed mobility at min guard level to prepare for OOB ADLs.  OT Frequency: Min 2X/week   Barriers to D/C:            Co-evaluation              End of Session Equipment Utilized During Treatment: Gait  belt;Rolling walker CPM Left Knee CPM Left Knee: Off Additional Comments: rolled towel under heel  Activity Tolerance: Patient limited by pain Patient left: in chair;with call bell/phone within reach;with family/visitor present   Time: 1308-65781312-1344 OT Time Calculation (min): 32 min Charges:  OT General Charges $OT Visit: 1 Procedure OT Evaluation $Initial OT Evaluation Tier I: 1 Procedure OT Treatments $Self Care/Home Management : 8-22 mins G-Codes:    Pilar GrammesMathews, Webb Weed H 03/21/2015, 2:22 PM

## 2015-03-22 ENCOUNTER — Inpatient Hospital Stay (HOSPITAL_COMMUNITY): Payer: Medicare Other

## 2015-03-22 LAB — CBC
HCT: 37.4 % — ABNORMAL LOW (ref 39.0–52.0)
Hemoglobin: 12.3 g/dL — ABNORMAL LOW (ref 13.0–17.0)
MCH: 29.6 pg (ref 26.0–34.0)
MCHC: 32.9 g/dL (ref 30.0–36.0)
MCV: 89.9 fL (ref 78.0–100.0)
PLATELETS: 194 10*3/uL (ref 150–400)
RBC: 4.16 MIL/uL — ABNORMAL LOW (ref 4.22–5.81)
RDW: 12.7 % (ref 11.5–15.5)
WBC: 7.3 10*3/uL (ref 4.0–10.5)

## 2015-03-22 MED ORDER — METHOCARBAMOL 500 MG PO TABS: 500.0000 mg | ORAL_TABLET | Freq: Four times a day (QID) | ORAL | Status: DC | PRN

## 2015-03-22 MED ORDER — OXYCODONE-ACETAMINOPHEN 10-325 MG PO TABS: 1.0000 | ORAL_TABLET | Freq: Four times a day (QID) | ORAL | Status: DC | PRN

## 2015-03-22 MED ORDER — MAGNESIUM CITRATE PO SOLN
1.0000 | Freq: Once | ORAL | Status: AC
  Administered 2015-03-22: 1 via ORAL
  Filled 2015-03-22: qty 296

## 2015-03-22 MED ORDER — BISACODYL 10 MG RE SUPP: 10.0000 mg | Freq: Once | RECTAL | Status: DC

## 2015-03-22 NOTE — Progress Notes (Signed)
Physical Therapy Treatment Patient Details Name: Johnny BolusMorris F Riedesel MRN: [id number] DOB: 07/17/1971 Today's Date: 03/22/2015    History of Present Illness Patient is a 44 y/o male s/p L TKA. PMH of developmental delay, sleep apnea and osteoarthritis.    PT Comments    Patient progressing slowly with mobility. Improved ambulation distance with cues for encouragement. Improving knee AROM. Encouraged OOB for all meals and to wear CPM. Will plan for stair training tomorrow to prepare for d/c. Will continue to follow and progress to maximize independence.  Follow Up Recommendations  Home health PT;Supervision/Assistance - 24 hour     Equipment Recommendations  Rolling walker with 5" wheels (bari RW)    Recommendations for Other Services       Precautions / Restrictions Precautions Precautions: Fall;Knee Precaution Booklet Issued: Yes (comment) Precaution Comments: reviewed no pillow under knee Restrictions Weight Bearing Restrictions: Yes LLE Weight Bearing: Weight bearing as tolerated    Mobility  Bed Mobility Overal bed mobility: Needs Assistance Bed Mobility: Sit to Supine       Sit to supine: Min assist   General bed mobility comments: Min A to bring LLE into bed. HOB flat, no rails.   Transfers Overall transfer level: Needs assistance Equipment used: Rolling walker (2 wheeled) (bari RW) Transfers: Sit to/from Stand Sit to Stand: Min guard         General transfer comment: Min guard for safety. Increased time. Encouraged foot placement during descent to emphasize knee flexion LLE. Stood from chair x2, from EOB x1.   Ambulation/Gait Ambulation/Gait assistance: Min guard Ambulation Distance (Feet): 120 Feet Assistive device: Rolling walker (2 wheeled) Gait Pattern/deviations: Step-to pattern;Decreased stance time - left;Decreased step length - right;Trunk flexed   Gait velocity interpretation: Below normal speed for age/gender General Gait Details: Multiple  standing rest breaks due to dypsnea and fatigue. Cues for knee extension during stance phase.    Stairs            Wheelchair Mobility    Modified Rankin (Stroke Patients Only)       Balance Overall balance assessment: Needs assistance Sitting-balance support: Feet supported;No upper extremity supported Sitting balance-Leahy Scale: Good     Standing balance support: During functional activity Standing balance-Leahy Scale: Poor                      Cognition Arousal/Alertness: Awake/alert Behavior During Therapy: WFL for tasks assessed/performed Overall Cognitive Status: Within Functional Limits for tasks assessed                      Exercises Total Joint Exercises Quad Sets: Left;5 reps;Supine Heel Slides: Left;5 reps;Seated Goniometric ROM: 14-75 degrees knee AROM.    General Comments General comments (skin integrity, edema, etc.): Pt's mother and friend in room.      Pertinent Vitals/Pain Pain Assessment: Faces Faces Pain Scale: Hurts even more Pain Location: left knee movement Pain Descriptors / Indicators: Grimacing;Sore;Aching Pain Intervention(s): Limited activity within patient's tolerance;Monitored during session;Repositioned    Home Living                      Prior Function            PT Goals (current goals can now be found in the care plan section) Progress towards PT goals: Progressing toward goals    Frequency  7X/week    PT Plan Current plan remains appropriate    Co-evaluation  End of Session Equipment Utilized During Treatment: Gait belt Activity Tolerance: Patient tolerated treatment well Patient left: in bed;with call bell/phone within reach;with bed alarm set;in CPM;with family/visitor present     Time: 1610-96041409-1449 PT Time Calculation (min) (ACUTE ONLY): 40 min  Charges:  $Gait Training: 23-37 mins $Therapeutic Exercise: 8-22 mins                    G Codes:      Aalliyah Kilker A  Oluwaferanmi Wain 03/22/2015, 2:59 PM  Mylo RedShauna Darling Cieslewicz, PT, DPT [phone]

## 2015-03-22 NOTE — Progress Notes (Signed)
Occupational Therapy Treatment Patient Details Name: Johnny Hayes F Bleau MRN: [id number] DOB: 12/14/1970 Today's Date: 03/22/2015    History of present illness Patient is a 44 y/o male s/p L TKA. PMH of developmental delay, sleep apnea and osteoarthritis.   OT comments  Pt. Progressing well with acute stated goals.  Able to complete bed mobility and amb. B.room distance with min guard a and inst. Cues for sequencing and safety.  Eager to d/c home and will be staying with his mom who can assist.  Also have a family friend who was present for this session who can also assist as needed.   Follow Up Recommendations  Supervision/Assistance - 24 hour;No OT follow up    Equipment Recommendations  3 in 1 bedside comode    Recommendations for Other Services      Precautions / Restrictions Precautions Precautions: Fall;Knee Precaution Comments: reviewed no pillow under knee Restrictions Weight Bearing Restrictions: No LLE Weight Bearing: Weight bearing as tolerated       Mobility Bed Mobility Overal bed mobility: Needs Assistance Bed Mobility: Supine to Sit     Supine to sit: Supervision     General bed mobility comments: hob flat, no rails, exiting to left for simulation of home environment, inst. cues for tech. but able to transiton into sitting and bring bles oob with S  Transfers Overall transfer level: Needs assistance Equipment used: Rolling walker (2 wheeled) Transfers: Sit to/from UGI CorporationStand;Stand Pivot Transfers Sit to Stand: Min guard Stand pivot transfers: Min guard       General transfer comment: Mod A to rise from low recliner surface. Cues for technique and hand placement. INcreased time to perform and encouragement.     Balance                                   ADL Overall ADL's : Needs assistance/impaired     Grooming: Set up;Sitting                   Toilet Transfer: Min guard;Ambulation;RW Toilet Transfer Details (indicate cue type and  reason): sim. eob then amb. to recliner Toileting- Clothing Manipulation and Hygiene: Minimal assistance;Sit to/from stand Toileting - Clothing Manipulation Details (indicate cue type and reason): simulated during transfer     Functional mobility during ADLs: Min guard;Cueing for sequencing;Rolling walker General ADL Comments: pt. had family friend present that often helps with his care as needed.  inst. both pt. and friend safe tech. for use of rw and transitioning from sit/stand and stand/sit. report he will be with his mother who can assist as needed      Vision                     Perception     Praxis      Cognition   Behavior During Therapy: WFL for tasks assessed/performed Overall Cognitive Status: Within Functional Limits for tasks assessed                       Extremity/Trunk Assessment               Exercises     Shoulder Instructions       General Comments      Pertinent Vitals/ Pain       Pain Assessment: No/denies pain  Home Living  Prior Functioning/Environment              Frequency Min 2X/week     Progress Toward Goals  OT Goals(current goals can now be found in the care plan section)  Progress towards OT goals: Progressing toward goals     Plan Discharge plan remains appropriate    Co-evaluation                 End of Session Equipment Utilized During Treatment: Gait belt;Rolling walker   Activity Tolerance Patient tolerated treatment well   Patient Left in chair;with call bell/phone within reach;with family/visitor present   Nurse Communication Other (comment) (family asking when they can assist pt. with in/out of bed with out staff assist)        Time: 0940-1000 OT Time Calculation (min): 20 min  Charges: OT General Charges $OT Visit: 1 Procedure OT Treatments $Self Care/Home Management : 8-22 mins  Johnny Hayes, Meleana Commerford Lorraine,  COTA/L 03/22/2015, 10:27 AM

## 2015-03-22 NOTE — Progress Notes (Signed)
PT Cancellation Note  Patient Details Name: Jethro BolusMorris F Colburn MRN: [id number] DOB: 08/17/1971   Cancelled Treatment:    Reason Eval/Treat Not Completed: Patient at procedure or test/unavailable Pt off floor at test. Will follow up in PM as time allows.   Blake DivineShauna A Lanae Federer 03/22/2015, 11:39 AM Mylo RedShauna Ankush Gintz, PT, DPT [phone]

## 2015-03-22 NOTE — Progress Notes (Signed)
Subjective: Knee pain controlled.  C/o abd discomfort.  No bowel movement yet.  Some flatus.  Denies N/V.  Limited ambulation.    Objective: Vital signs in last 24 hours: Temp:  [98.1 F (36.7 C)-99.1 F (37.3 C)] 98.3 F (36.8 C) (05/20 0451) Pulse Rate:  [101-109] 101 (05/20 0451) Resp:  [18-19] 18 (05/20 0451) BP: (139-146)/(63-85) 146/63 mmHg (05/20 0451) SpO2:  [95 %-97 %] 95 % (05/20 0451)  Intake/Output from previous day: 05/19 0701 - 05/20 0700 In: 1931.3 [P.O.:838; I.V.:1093.3] Out: 1801 [Urine:1801] Intake/Output this shift: Total I/O In: 240 [P.O.:240] Out: -    Recent Labs  03/21/15 0607 03/22/15 0440  HGB 12.6* 12.3*    Recent Labs  03/21/15 0607 03/22/15 0440  WBC 8.7 7.3  RBC 4.34 4.16*  HCT 39.8 37.4*  PLT 205 194    Recent Labs  03/21/15 0607  NA 132*  K 4.4  CL 101  CO2 25  BUN 9  CREATININE 1.15  GLUCOSE 110*  CALCIUM 7.8*   No results for input(s): LABPT, INR in the last 72 hours.  Exam:  Knee wound looks good.  steris intact.  No drainage or signs of infection.  Calf nontender.  Abdomen round, somewhat distended and tender.    Assessment/Plan: Will get stat KUB.  Anticipate d/c home over the weekend.     OWENS,JAMES M 03/22/2015, 10:58 AM

## 2015-03-23 LAB — CBC
HEMATOCRIT: 34.8 % — AB (ref 39.0–52.0)
Hemoglobin: 11.5 g/dL — ABNORMAL LOW (ref 13.0–17.0)
MCH: 29.8 pg (ref 26.0–34.0)
MCHC: 33 g/dL (ref 30.0–36.0)
MCV: 90.2 fL (ref 78.0–100.0)
Platelets: 186 10*3/uL (ref 150–400)
RBC: 3.86 MIL/uL — ABNORMAL LOW (ref 4.22–5.81)
RDW: 12.6 % (ref 11.5–15.5)
WBC: 8.7 10*3/uL (ref 4.0–10.5)

## 2015-03-23 MED ORDER — BISACODYL 10 MG RE SUPP: 10.0000 mg | Freq: Once | RECTAL | Status: DC

## 2015-03-23 NOTE — Progress Notes (Signed)
Physical Therapy Treatment Patient Details Name: Johnny Hayes MRN: [id number] DOB: 13-Dec-1970 Today's Date: 03/23/2015    History of Present Illness Patient is a 44 y/o male s/p L TKA. PMH of developmental delay, sleep apnea and osteoarthritis.    PT Comments    Patient agreeable to therapy, just received pain medication.  Transfers and gait with MIN Guard assist, slow pace, several standing rest breaks.  Stair training with MOD assist, cues for technique and requiring heavy assist with bilateral rails.  Patient progressing well, pain and ROM greatest limitation at this time, continue per current plan of care.  Follow Up Recommendations  Home health PT;Supervision/Assistance - 24 hour     Equipment Recommendations  Rolling walker with 5" wheels (bari RW)    Recommendations for Other Services       Precautions / Restrictions Precautions Precautions: Fall;Knee Restrictions Weight Bearing Restrictions: No LLE Weight Bearing: Weight bearing as tolerated    Mobility  Bed Mobility                  Transfers Overall transfer level: Needs assistance Equipment used: Rolling walker (2 wheeled) (bari RW)   Sit to Stand: Min guard Stand pivot transfers: Min guard       General transfer comment: Min guard for safety. Increased time. Encouraged foot placement during descent to emphasize knee flexion LLE. Stood from chair x2, from EOB x1.   Ambulation/Gait Ambulation/Gait assistance: Min guard Ambulation Distance (Feet): 150 Feet Assistive device: Rolling walker (2 wheeled) Gait Pattern/deviations: Step-to pattern;Decreased step length - right;Wide base of support   Gait velocity interpretation: Below normal speed for age/gender General Gait Details: Multiple standing rest breaks due to dypsnea and fatigue.   Stairs Stairs: Yes Stairs assistance: Mod assist Stair Management: Two rails;Step to pattern Number of Stairs: 4 General stair comments: Cues for technique,  increased time  Wheelchair Mobility    Modified Rankin (Stroke Patients Only)       Balance     Sitting balance-Leahy Scale: Good       Standing balance-Leahy Scale: Poor                      Cognition Arousal/Alertness: Awake/alert Behavior During Therapy: WFL for tasks assessed/performed Overall Cognitive Status: Within Functional Limits for tasks assessed                      Exercises Total Joint Exercises Long Arc Quad: Left;5 reps;Seated Knee Flexion: Left;5 reps;Seated    General Comments        Pertinent Vitals/Pain Pain Assessment: 0-10 Pain Score: 6  Faces Pain Scale: Hurts even more Pain Location: L knee Pain Descriptors / Indicators: Aching;Grimacing;Sore Pain Intervention(s): Monitored during session;Limited activity within patient's tolerance;Repositioned    Home Living Family/patient expects to be discharged to:: Private residence Living Arrangements: Parent;Spouse/significant other Available Help at Discharge: Family;Friend(s);Available 24 hours/day Type of Home: House Home Access: Stairs to enter Entrance Stairs-Rails: None Home Layout: One level Home Equipment: Hand held shower head Additional Comments: discussed plan to use walkin shower    Prior Function Level of Independence: Independent          PT Goals (current goals can now be found in the care plan section) Acute Rehab PT Goals Patient Stated Goal: to go home PT Goal Formulation: With patient Time For Goal Achievement: 04/04/15 Potential to Achieve Goals: Fair    Frequency  7X/week    PT Plan Current plan remains  appropriate    Co-evaluation             End of Session Equipment Utilized During Treatment: Gait belt Activity Tolerance: Patient tolerated treatment well Patient left: in chair;with call bell/phone within reach;with family/visitor present     Time: [id number] PT Time Calculation (min) (ACUTE ONLY): 45 min  Charges:  $Gait  Training: 23-37 mins $Therapeutic Activity: 8-22 mins                    G Codes:      Anneliese Leblond L 03/23/2015, 10:31 AM

## 2015-03-23 NOTE — Plan of Care (Signed)
Problem: Consults Goal: Diagnosis- Total Joint Replacement Primary Total Knee     

## 2015-03-23 NOTE — Progress Notes (Addendum)
Subjective: Patient stable doing well and CPM and mobilizing in room   Objective: Vital signs in last 24 hours: Temp:  [98 F (36.7 C)-98.7 F (37.1 C)] 98 F (36.7 C) (05/21 0456) Pulse Rate:  [91-106] 91 (05/21 0456) Resp:  [18-20] 18 (05/21 0456) BP: (118-139)/(7-76) 138/74 mmHg (05/21 0456) SpO2:  [96 %-100 %] 100 % (05/21 0456)  Intake/Output from previous day: 05/20 0701 - 05/21 0700 In: 720 [P.O.:720] Out: 1150 [Urine:1150] Intake/Output this shift:    Exam:  Neurovascular intact Sensation intact distally Intact pulses distally Dorsiflexion/Plantar flexion intact  Labs:  Recent Labs  03/21/15 0607 03/22/15 0440 03/23/15 0336  HGB 12.6* 12.3* 11.5*    Recent Labs  03/22/15 0440 03/23/15 0336  WBC 7.3 8.7  RBC 4.16* 3.86*  HCT 37.4* 34.8*  PLT 194 186    Recent Labs  03/21/15 0607  NA 132*  K 4.4  CL 101  CO2 25  BUN 9  CREATININE 1.15  GLUCOSE 110*  CALCIUM 7.8*   No results for input(s): LABPT, INR in the last 72 hours.  Assessment/Plan: Patient doing well and feels like he is prepared and ready to go home today. Wife is agreeable.KUB negative   Axel Meas SCOTT 03/23/2015, 9:12 AM

## 2015-04-02 NOTE — Discharge Summary (Signed)
Patient ID: Johnny BolusMorris F Christy MRN: [id number] DOB/AGE: 44/01/1971 44 y.o.  Admit date: 03/20/2015 Discharge date: 04/02/2015  Admission Diagnoses:  Active Problems:   Status post total left knee replacement   Discharge Diagnoses:  Active Problems:   Status post total left knee replacement  status post Procedure(s): COMPUTER ASSISTED TOTAL KNEE ARTHROPLASTY  Past Medical History  Diagnosis Date  . Development delay     "no longer takes depakote"  . Osteoarthritis of knee     left  . Sleep apnea     Surgeries: Procedure(s): COMPUTER ASSISTED TOTAL KNEE ARTHROPLASTY on 03/20/2015   Consultants:    Discharged Condition: Improved  Hospital Course: Johnny Hayes is an 44 y.o. male who was admitted 03/20/2015 for operative treatment of left knee djd. Patient failed conservative treatments (please see the history and physical for the specifics) and had severe unremitting pain that affects sleep, daily activities and work/hobbies. After pre-op clearance, the patient was taken to the operating room on 03/20/2015 and underwent  Procedure(s): COMPUTER ASSISTED TOTAL KNEE ARTHROPLASTY.    Patient was given perioperative antibiotics:  Anti-infectives    Start     Dose/Rate Route Frequency Ordered Stop   03/20/15 1145  ceFAZolin (ANCEF) 3 g in dextrose 5 % 50 mL IVPB     3 g 160 mL/hr over 30 Minutes Intravenous To Short Stay 03/20/15 1137 03/20/15 1303       Patient was given sequential compression devices and early ambulation to prevent DVT.   Patient benefited maximally from hospital stay and there were no complications. At the time of discharge, the patient was urinating/moving their bowels without difficulty, tolerating a regular diet, pain is controlled with oral pain medications and they have been cleared by PT/OT.   Recent vital signs: No data found.    Recent laboratory studies: No results for input(s): WBC, HGB, HCT, PLT, NA, K, CL, CO2, BUN, CREATININE, GLUCOSE, INR,  CALCIUM in the last 72 hours.  Invalid input(s): PT, 2   Discharge Medications:     Medication List    STOP taking these medications        acetaminophen 500 MG tablet  Commonly known as:  TYLENOL     NON FORMULARY      TAKE these medications        bisacodyl 10 MG suppository  Commonly known as:  DULCOLAX  Place 1 suppository (10 mg total) rectally once.     methocarbamol 500 MG tablet  Commonly known as:  ROBAXIN  Take 1 tablet (500 mg total) by mouth every 6 (six) hours as needed for muscle spasms.     oxyCODONE-acetaminophen 10-325 MG per tablet  Commonly known as:  PERCOCET  Take 1 tablet by mouth every 6 (six) hours as needed for pain.        Diagnostic Studies: Dg Chest 2 View  03/06/2015   CLINICAL DATA:  Arthroplasty.  Smoker.  EXAM: CHEST  2 VIEW  COMPARISON:  None.  FINDINGS: Mediastinum and hilar structures are normal. Mild bilateral interstitial prominence noted. These changes may be chronic. Active pneumonitis cannot be excluded. No pleural effusion or pneumothorax. Borderline cardiomegaly with normal pulmonary vascularity. No acute bony abnormality .  IMPRESSION: 1. Mild bilateral pulmonary interstitial prominence. These changes may be chronic. Active pneumonitis cannot be entirely excluded.  2.  Borderline cardiomegaly.  No pulmonary venous congestion.   Electronically Signed   By: Maisie Fushomas  Register   On: 03/06/2015 09:44   Dg Abd 1  View  03/22/2015   CLINICAL DATA:  Mid abdominal pain and distension, recent knee surgery  EXAM: ABDOMEN - 1 VIEW  COMPARISON:  None.  FINDINGS: Scattered large and small bowel gas is noted. No obstructive changes are seen. No free air is noted. No acute bony abnormality is noted.  IMPRESSION: Nonspecific abdomen.   Electronically Signed   By: Alcide Clever M.D.   On: 03/22/2015 12:22        Discharge Instructions    Call MD / Call 911    Complete by:  As directed   If you experience chest pain or shortness of breath, CALL 911 and  be transported to the hospital emergency room.  If you develope a fever above 101 F, pus (white drainage) or increased drainage or redness at the wound, or calf pain, call your surgeon's office.     Call MD / Call 911    Complete by:  As directed   If you experience chest pain or shortness of breath, CALL 911 and be transported to the hospital emergency room.  If you develope a fever above 101 F, pus (white drainage) or increased drainage or redness at the wound, or calf pain, call your surgeon's office.     Change dressing    Complete by:  As directed   Daily dressing changes with 4x4 gauze and tape.     Constipation Prevention    Complete by:  As directed   Drink plenty of fluids.  Prune juice may be helpful.  You may use a stool softener, such as Colace (over the counter) 100 mg twice a day.  Use MiraLax (over the counter) for constipation as needed.     Constipation Prevention    Complete by:  As directed   Drink plenty of fluids.  Prune juice may be helpful.  You may use a stool softener, such as Colace (over the counter) 100 mg twice a day.  Use MiraLax (over the counter) for constipation as needed.     Diet - low sodium heart healthy    Complete by:  As directed      Diet - low sodium heart healthy    Complete by:  As directed      Discharge instructions    Complete by:  As directed   Ok to shower, but no tub soaking.  Do not apply any creams or ointments to incision.  Continue physical therapy protocol.     Do not put a pillow under the knee. Place it under the heel.    Complete by:  As directed      Driving restrictions    Complete by:  As directed   No driving     Increase activity slowly as tolerated    Complete by:  As directed      Increase activity slowly as tolerated    Complete by:  As directed      Lifting restrictions    Complete by:  As directed   No lifting           Follow-up Information    Follow up with [facility].   Why:  Someone from  [facility] will contact you concerning start date and time for therapy.   Contact information:   [address] [place] [address] [phone]       Schedule an appointment as soon as possible for a visit with Eldred Manges, MD.   Specialty:  Orthopedic Surgery  Why:  need return office visit 2 weeks postop   Contact information:   [address] [place] [address] [phone]       Discharge Plan:  discharge to home  Disposition:     Signed: Naida Sleight 04/02/2015, 3:01 PM

## 2017-08-04 DIAGNOSIS — G4733 Obstructive sleep apnea (adult) (pediatric): Secondary | ICD-10-CM | POA: Insufficient documentation

## 2017-08-15 DIAGNOSIS — I10 Essential (primary) hypertension: Secondary | ICD-10-CM | POA: Insufficient documentation

## 2018-05-27 ENCOUNTER — Ambulatory Visit (HOSPITAL_COMMUNITY)
Admission: RE | Admit: 2018-05-27 | Discharge: 2018-05-27 | Disposition: A | Discharge status: Home / Self Care | Payer: Medicare Other | Source: Ambulatory Visit | Attending: Otolaryngology | Admitting: Otolaryngology

## 2018-05-27 ENCOUNTER — Ambulatory Visit (HOSPITAL_COMMUNITY): Payer: Medicare Other | Admitting: Certified Registered Nurse Anesthetist

## 2018-05-27 ENCOUNTER — Encounter (HOSPITAL_COMMUNITY): Payer: Self-pay | Admitting: Certified Registered Nurse Anesthetist

## 2018-05-27 ENCOUNTER — Encounter (HOSPITAL_COMMUNITY)
Admission: RE | Disposition: A | Discharge status: Home / Self Care | Payer: Self-pay | Source: Ambulatory Visit | Attending: Otolaryngology

## 2018-05-27 DIAGNOSIS — Z6841 Body Mass Index (BMI) 40.0 and over, adult: Secondary | ICD-10-CM | POA: Insufficient documentation

## 2018-05-27 DIAGNOSIS — R1310 Dysphagia, unspecified: Secondary | ICD-10-CM | POA: Insufficient documentation

## 2018-05-27 DIAGNOSIS — T17208A Unspecified foreign body in pharynx causing other injury, initial encounter: Secondary | ICD-10-CM | POA: Diagnosis present

## 2018-05-27 DIAGNOSIS — G4730 Sleep apnea, unspecified: Secondary | ICD-10-CM | POA: Insufficient documentation

## 2018-05-27 DIAGNOSIS — Z9989 Dependence on other enabling machines and devices: Secondary | ICD-10-CM | POA: Diagnosis not present

## 2018-05-27 DIAGNOSIS — X58XXXA Exposure to other specified factors, initial encounter: Secondary | ICD-10-CM | POA: Insufficient documentation

## 2018-05-27 DIAGNOSIS — R1312 Dysphagia, oropharyngeal phase: Secondary | ICD-10-CM | POA: Insufficient documentation

## 2018-05-27 HISTORY — PX: DIRECT LARYNGOSCOPY: SHX5326

## 2018-05-27 LAB — BASIC METABOLIC PANEL
ANION GAP: 9 (ref 5–15)
BUN: 11 mg/dL (ref 6–20)
CALCIUM: 9.5 mg/dL (ref 8.9–10.3)
CO2: 27 mmol/L (ref 22–32)
Chloride: 103 mmol/L (ref 98–111)
Creatinine, Ser: 1.14 mg/dL (ref 0.61–1.24)
Glucose, Bld: 103 mg/dL — ABNORMAL HIGH (ref 70–99)
Potassium: 4.3 mmol/L (ref 3.5–5.1)
SODIUM: 139 mmol/L (ref 135–145)

## 2018-05-27 LAB — CBC
HEMATOCRIT: 49.4 % (ref 39.0–52.0)
Hemoglobin: 15.9 g/dL (ref 13.0–17.0)
MCH: 29.5 pg (ref 26.0–34.0)
MCHC: 32.2 g/dL (ref 30.0–36.0)
MCV: 91.7 fL (ref 78.0–100.0)
Platelets: 259 10*3/uL (ref 150–400)
RBC: 5.39 MIL/uL (ref 4.22–5.81)
RDW: 12.5 % (ref 11.5–15.5)
WBC: 7.5 10*3/uL (ref 4.0–10.5)

## 2018-05-27 SURGERY — LARYNGOSCOPY, DIRECT
Anesthesia: General | Site: Throat

## 2018-05-27 MED ORDER — OXYCODONE HCL 5 MG PO TABS: 5.0000 mg | ORAL_TABLET | Freq: Once | ORAL | Status: DC | PRN

## 2018-05-27 MED ORDER — PHENYLEPHRINE 40 MCG/ML (10ML) SYRINGE FOR IV PUSH (FOR BLOOD PRESSURE SUPPORT)
PREFILLED_SYRINGE | INTRAVENOUS | Status: AC
  Filled 2018-05-27: qty 10

## 2018-05-27 MED ORDER — PHENYLEPHRINE 40 MCG/ML (10ML) SYRINGE FOR IV PUSH (FOR BLOOD PRESSURE SUPPORT)
PREFILLED_SYRINGE | INTRAVENOUS | Status: DC | PRN
  Administered 2018-05-27: 80 ug via INTRAVENOUS

## 2018-05-27 MED ORDER — OXYMETAZOLINE HCL 0.05 % NA SOLN
NASAL | Status: AC
  Filled 2018-05-27: qty 15

## 2018-05-27 MED ORDER — ONDANSETRON HCL 4 MG/2ML IJ SOLN
INTRAMUSCULAR | Status: DC | PRN
  Administered 2018-05-27: 4 mg via INTRAVENOUS

## 2018-05-27 MED ORDER — PROPOFOL 10 MG/ML IV BOLUS
INTRAVENOUS | Status: DC | PRN
  Administered 2018-05-27: 60 mg via INTRAVENOUS

## 2018-05-27 MED ORDER — DEXAMETHASONE SODIUM PHOSPHATE 10 MG/ML IJ SOLN
INTRAMUSCULAR | Status: DC | PRN
  Administered 2018-05-27: 10 mg via INTRAVENOUS

## 2018-05-27 MED ORDER — MIDAZOLAM HCL 2 MG/2ML IJ SOLN
INTRAMUSCULAR | Status: AC
  Filled 2018-05-27: qty 2

## 2018-05-27 MED ORDER — ONDANSETRON HCL 4 MG/2ML IJ SOLN: 4.0000 mg | Freq: Once | INTRAMUSCULAR | Status: DC | PRN

## 2018-05-27 MED ORDER — ONDANSETRON HCL 4 MG/2ML IJ SOLN
INTRAMUSCULAR | Status: AC
  Filled 2018-05-27: qty 4

## 2018-05-27 MED ORDER — MEPERIDINE HCL 50 MG/ML IJ SOLN: 6.2500 mg | INTRAMUSCULAR | Status: DC | PRN

## 2018-05-27 MED ORDER — PROPOFOL 10 MG/ML IV BOLUS: INTRAVENOUS | Status: DC | PRN

## 2018-05-27 MED ORDER — EPINEPHRINE HCL (NASAL) 0.1 % NA SOLN
NASAL | Status: AC
  Filled 2018-05-27: qty 30

## 2018-05-27 MED ORDER — FENTANYL CITRATE (PF) 250 MCG/5ML IJ SOLN
INTRAMUSCULAR | Status: AC
  Filled 2018-05-27: qty 5

## 2018-05-27 MED ORDER — PROPOFOL 10 MG/ML IV BOLUS
INTRAVENOUS | Status: AC
  Filled 2018-05-27: qty 20

## 2018-05-27 MED ORDER — LIDOCAINE 2% (20 MG/ML) 5 ML SYRINGE
INTRAMUSCULAR | Status: AC
  Filled 2018-05-27: qty 5

## 2018-05-27 MED ORDER — LIDOCAINE HCL (CARDIAC) PF 100 MG/5ML IV SOSY
PREFILLED_SYRINGE | INTRAVENOUS | Status: DC | PRN
  Administered 2018-05-27: 100 mg via INTRAVENOUS

## 2018-05-27 MED ORDER — DEXAMETHASONE SODIUM PHOSPHATE 10 MG/ML IJ SOLN
INTRAMUSCULAR | Status: AC
  Filled 2018-05-27: qty 2

## 2018-05-27 MED ORDER — TRIAMCINOLONE ACETONIDE 40 MG/ML IJ SUSP
INTRAMUSCULAR | Status: AC
  Filled 2018-05-27: qty 5

## 2018-05-27 MED ORDER — LACTATED RINGERS IV SOLN
INTRAVENOUS | Status: DC | PRN
  Administered 2018-05-27: 18:00:00 via INTRAVENOUS

## 2018-05-27 MED ORDER — ACETAMINOPHEN 325 MG PO TABS: 325.0000 mg | ORAL_TABLET | ORAL | Status: DC | PRN

## 2018-05-27 MED ORDER — 0.9 % SODIUM CHLORIDE (POUR BTL) OPTIME
TOPICAL | Status: DC | PRN
  Administered 2018-05-27: 1000 mL

## 2018-05-27 MED ORDER — FENTANYL CITRATE (PF) 100 MCG/2ML IJ SOLN
INTRAMUSCULAR | Status: DC
Start: 2018-05-27 — End: 2018-05-27
  Filled 2018-05-27: qty 2

## 2018-05-27 MED ORDER — PROPOFOL 500 MG/50ML IV EMUL
INTRAVENOUS | Status: DC | PRN
  Administered 2018-05-27: 200 ug/kg/min via INTRAVENOUS

## 2018-05-27 MED ORDER — OXYCODONE HCL 5 MG/5ML PO SOLN: 5.0000 mg | Freq: Once | ORAL | Status: DC | PRN

## 2018-05-27 MED ORDER — ACETAMINOPHEN 160 MG/5ML PO SOLN: 325.0000 mg | ORAL | Status: DC | PRN

## 2018-05-27 MED ORDER — FENTANYL CITRATE (PF) 100 MCG/2ML IJ SOLN
25.0000 ug | INTRAMUSCULAR | Status: DC | PRN
  Administered 2018-05-27: 50 ug via INTRAVENOUS

## 2018-05-27 MED ORDER — MIDAZOLAM HCL 5 MG/5ML IJ SOLN
INTRAMUSCULAR | Status: DC | PRN
  Administered 2018-05-27: 2 mg via INTRAVENOUS

## 2018-05-27 SURGICAL SUPPLY — 25 items
BALLN PULM 15 16.5 18 X 75CM (BALLOONS)
BALLN PULM 15 16.5 18X75 (BALLOONS)
BALLOON PULM 15 16.5 18X75 (BALLOONS) IMPLANT
BLADE SURG 15 STRL LF DISP TIS (BLADE) IMPLANT
BLADE SURG 15 STRL SS (BLADE)
CONT SPEC 4OZ CLIKSEAL STRL BL (MISCELLANEOUS) IMPLANT
COVER BACK TABLE 60X90IN (DRAPES) ×3 IMPLANT
COVER MAYO STAND STRL (DRAPES) ×3 IMPLANT
CRADLE DONUT ADULT HEAD (MISCELLANEOUS) IMPLANT
DRAPE HALF SHEET 40X57 (DRAPES) ×3 IMPLANT
GLOVE ECLIPSE 8.0 STRL XLNG CF (GLOVE) ×3 IMPLANT
GOWN BRE IMP SLV AUR LG STRL (GOWN DISPOSABLE) IMPLANT
GUARD TEETH (MISCELLANEOUS) ×3 IMPLANT
KIT BASIN OR (CUSTOM PROCEDURE TRAY) ×3 IMPLANT
KIT TURNOVER KIT B (KITS) IMPLANT
NEEDLE HYPO 25GX1X1/2 BEV (NEEDLE) IMPLANT
NS IRRIG 1000ML POUR BTL (IV SOLUTION) ×3 IMPLANT
PAD ARMBOARD 7.5X6 YLW CONV (MISCELLANEOUS) IMPLANT
PATTIES SURGICAL .5 X3 (DISPOSABLE) IMPLANT
SOLUTION ANTI FOG 6CC (MISCELLANEOUS) IMPLANT
SURGILUBE 2OZ TUBE FLIPTOP (MISCELLANEOUS) IMPLANT
SYR INFLATE BILIARY GAUGE (MISCELLANEOUS) IMPLANT
TOWEL OR 17X24 6PK STRL BLUE (TOWEL DISPOSABLE) ×3 IMPLANT
TUBE CONNECTING 12'X1/4 (SUCTIONS) ×1
TUBE CONNECTING 12X1/4 (SUCTIONS) ×2 IMPLANT

## 2018-05-27 NOTE — Anesthesia Preprocedure Evaluation (Addendum)
Anesthesia Evaluation  Patient identified by MRN, date of birth, ID band Patient awake    Reviewed: Allergy & Precautions, H&P , NPO status , Patient's Chart, lab work & pertinent test results  Airway Mallampati: III  TM Distance: >3 FB Neck ROM: Full    Dental no notable dental hx. (+) Edentulous Upper, Dental Advisory Given   Pulmonary sleep apnea and Continuous Positive Airway Pressure Ventilation ,    Pulmonary exam normal breath sounds clear to auscultation       Cardiovascular  Rhythm:Regular Rate:Normal  EKG Normal sinus rhythm Left atrial enlargement Right bundle branch block   Neuro/Psych    GI/Hepatic   Endo/Other  Morbid obesity  Renal/GU   negative genitourinary   Musculoskeletal  (+) Arthritis , Osteoarthritis,    Abdominal   Peds  Hematology   Anesthesia Other Findings   Reproductive/Obstetrics                            Anesthesia Physical  Anesthesia Plan  ASA: III  Anesthesia Plan: General   Post-op Pain Management:    Induction: Intravenous  PONV Risk Score and Plan: 2 and Ondansetron, Dexamethasone and Treatment may vary due to age or medical condition  Airway Management Planned: Oral ETT  Additional Equipment:   Intra-op Plan:   Post-operative Plan: Extubation in OR  Informed Consent: I have reviewed the patients History and Physical, chart, labs and discussed the procedure including the risks, benefits and alternatives for the proposed anesthesia with the patient or authorized representative who has indicated his/her understanding and acceptance.   Dental advisory given  Plan Discussed with: CRNA, Anesthesiologist and Surgeon  Anesthesia Plan Comments: (  )        Anesthesia Quick Evaluation

## 2018-05-27 NOTE — H&P (Signed)
Johnny Hayes, Johnny Hayes 46 y.o., male [id number]     Chief Complaint: pharyngeal foreign body  HPI: 47 yo bm, onset gag and difficulty swallowing earlier today.  CT suggests metallic foreign body in midline vallecula.  No breathing difficulty.  No prior similar problems.  PMH: Past Medical History:  Diagnosis Date  . Development delay    "no longer takes depakote"  . Osteoarthritis of knee    left  . Sleep apnea     Surg Hx: Past Surgical History:  Procedure Laterality Date  . KNEE ARTHROPLASTY Left 03/20/2015   Procedure: COMPUTER ASSISTED TOTAL KNEE ARTHROPLASTY;  Surgeon: Johnny Killings, MD;  Location: [facility];  Service: Orthopedics;  Laterality: Left;  . KNEE ARTHROSCOPY W/ DEBRIDEMENT Left 2001    FHx:  History reviewed. No pertinent family history. SocHx:  reports that he has never smoked. He does not have any smokeless tobacco history on file. He reports that he does not drink alcohol or use drugs.  ALLERGIES:  Allergies  Allergen Reactions  . Penicillins Hives    Has patient had a PCN reaction causing immediate rash, facial/tongue/throat swelling, SOB or lightheadedness with hypotension: Yes Has patient had a PCN reaction causing severe rash involving mucus membranes or skin necrosis: No Has patient had a PCN reaction that required hospitalization: Yes Has patient had a PCN reaction occurring within the last 10 years: No If all of the above answers are "NO", then may proceed with Cephalosporin use.     Medications Prior to Admission  Medication Sig Dispense Refill  . naproxen sodium (ALEVE) 220 MG tablet Take 220-440 mg by mouth 2 (two) times daily as needed (pain).    . bisacodyl (DULCOLAX) 10 MG suppository Place 1 suppository (10 mg total) rectally once. (Patient not taking: Reported on 05/27/2018) 12 suppository 0  . methocarbamol (ROBAXIN) 500 MG tablet Take 1 tablet (500 mg total) by mouth every 6 (six) hours as needed for muscle spasms. (Patient not taking: Reported on  05/27/2018) 60 tablet 0  . oxyCODONE-acetaminophen (PERCOCET) 10-325 MG per tablet Take 1 tablet by mouth every 6 (six) hours as needed for pain. (Patient not taking: Reported on 05/27/2018) 60 tablet 0    Results for orders placed or performed during the hospital encounter of 05/27/18 (from the past 48 hour(s))  CBC     Status: None   Collection Time: 05/27/18  4:28 PM  Result Value Ref Range   WBC 7.5 4.0 - 10.5 K/uL   RBC 5.39 4.22 - 5.81 MIL/uL   Hemoglobin 15.9 13.0 - 17.0 g/dL   HCT 49.4 39.0 - 52.0 %   MCV 91.7 78.0 - 100.0 fL   MCH 29.5 26.0 - 34.0 pg   MCHC 32.2 30.0 - 36.0 g/dL   RDW 12.5 11.5 - 15.5 %   Platelets 259 150 - 400 K/uL    Comment: Performed at [facility], [place] [address], [place],  [address]  Basic metabolic panel     Status: Abnormal   Collection Time: 05/27/18  4:28 PM  Result Value Ref Range   Sodium 139 135 - 145 mmol/L   Potassium 4.3 3.5 - 5.1 mmol/L   Chloride 103 98 - 111 mmol/L   CO2 27 22 - 32 mmol/L   Glucose, Bld 103 (H) 70 - 99 mg/dL   BUN 11 6 - 20 mg/dL   Creatinine, Ser 1.14 0.61 - 1.24 mg/dL   Calcium 9.5 8.9 - 10.3 mg/dL   GFR calc  non Af Amer >60 >60 mL/min   GFR calc Af Amer >60 >60 mL/min    Comment: (NOTE) The eGFR has been calculated using the CKD EPI equation. This calculation has not been validated in all clinical situations. eGFR's persistently <60 mL/min signify possible Chronic Kidney Disease.    Anion gap 9 5 - 15    Comment: Performed at [facility] [address], [place], [place] [address]   No results found.    Blood pressure 140/76, pulse 97, resp. rate 18, height 6' 6"  (1.981 m), weight (!) 166.5 kg (367 lb).  PHYSICAL EXAM: Overall appearance:  Large framed.   Head: NCAT Ears:  clear Nose:  clear Oral Cavity: clear Oral Pharynx/Hypopharynx/Larynx:  Nl to flexible laryngoscopy Neuro: grossly intact Neck:  clear  Studies Reviewed: CT neck    Assessment/Plan Presumed vallecular  foreign body.  Plan:  To OR for eval under anesthesia, possible foreign body removal.  Johnny Hayes, Johnny Hayes 1/93/7902, 5:27 PM

## 2018-05-27 NOTE — Transfer of Care (Signed)
Immediate Anesthesia Transfer of Care Note  Patient: Johnny Hayes  Procedure(s) Performed: DIRECT LARYNGOSCOPY and ESOPHAGOSCOPY (N/A Throat)  Patient Location: PACU  Anesthesia Type:General  Level of Consciousness: drowsy and responds to stimulation  Airway & Oxygen Therapy: Patient Spontanous Breathing and Patient connected to face mask oxygen  Post-op Assessment: Report given to RN and Post -op Vital signs reviewed and stable  Post vital signs: Reviewed and stable  Last Vitals:  Vitals Value Taken Time  BP 110/40 05/27/2018  6:19 PM  Temp    Pulse 100 05/27/2018  6:20 PM  Resp 19 05/27/2018  6:20 PM  SpO2 100 % 05/27/2018  6:20 PM  Vitals shown include unvalidated device data.  Last Pain:  Vitals:   05/27/18 1612  PainSc: 3       Patients Stated Pain Goal: 2 (05/27/18 1612)  Complications: No apparent anesthesia complications

## 2018-05-27 NOTE — Op Note (Signed)
05/27/2018  6:09 PM    Johnny Hayes, Johnny Hayes  [id number]   Pre-Op Dx:  Vallecular foreign body  Post-op Dx:  same  Proc: Direct Laryngoscopy, Esophagoscopy   Surg:  Flo ShanksWOLICKI, Maribella Kuna T MD  Anes:  GOT  EBL:  none  Comp:  none  Findings:  No foreign body identified  Proc:  Direct Laryngoscopy ,  Esophagoscopy  Procedure: With the patient in a comfortable supine position, Mask anesthesia was induced without difficulty.  At an appropriate level, the table was turned 90 degrees away from Anesthesia.  A clean preparation and draping was performed in the standard fashion.  A surgical time out was obtained in the standard fashion.  A moist 4x4 was used to protect the upper gums was placed.     Using the [facility] laryngoscope, the larynx was visualized. The area of concern on CT scan at the epiglottic frenulum  In the vallecula was carefully inspected.  No foreign body was identified.   The patient was intubated without difficulty.    The anterior commissure laryngoscope was introduced taking care to protect lips, teeth, and endotracheal tube.  Complete laryngoscopy was performed in the standard fashion.  The findings were as described above.  The laryngoscope was removed.  The cervical esophagoscope was lubricated and inserted into the hypopharynx.  With gentle pressure, it was passed through the cricopharyngeus and advanced to its full length without difficulty with the findings as described above.  It was removed.  The oropharynx, oral cavity, nasopharynx and hypopharynx were palpated with findings as described above.  The neck was palpated on both sides with the findings as described above.  At this point the procedure was completed. The tooth guard was removed.   Dental status was intact.  The patient was returned to Anesthesia, awakened, extubated, and transferred to PACU in satisfactory condition.   Dispo:   PACU to home   Plan:    Routine diet and activity.  Recheck my office 2  weeks.    Cephus RicherWOLICKI,  Domique Clapper T MD

## 2018-05-27 NOTE — Anesthesia Procedure Notes (Signed)
Procedure Name: Intubation Date/Time: 05/27/2018 5:55 PM Performed by: White, Cordella RegisterKelsey Tena Jotham Ahn, CRNA Pre-anesthesia Checklist: Patient identified, Emergency Drugs available, Suction available and Patient being monitored Patient Re-evaluated:Patient Re-evaluated prior to induction Oxygen Delivery Method: Circle System Utilized Preoxygenation: Pre-oxygenation with 100% oxygen Induction Type: IV induction Ventilation: Mask ventilation without difficulty Laryngoscope size: Procedural laryngoscope per Lazarus SalinesWolicki, MD. Grade View: Grade I Tube type: Oral Tube size: 7.0 mm Number of attempts: 1 Airway Equipment and Method: Stylet Placement Confirmation: ETT inserted through vocal cords under direct vision,  positive ETCO2 and breath sounds checked- equal and bilateral Secured at: 23 cm Tube secured with: Tape Dental Injury: Teeth and Oropharynx as per pre-operative assessment  Comments: Intubation by Lazarus SalinesWolicki, MD

## 2018-05-27 NOTE — Discharge Instructions (Signed)
Continue BiPAP Diet as comfortable Recheck my office 2 weeks please

## 2018-05-28 NOTE — Anesthesia Postprocedure Evaluation (Signed)
Anesthesia Post Note  Patient: Johnny Hayes  Procedure(s) Performed: DIRECT LARYNGOSCOPY and ESOPHAGOSCOPY (N/A Throat)     Patient location during evaluation: PACU Anesthesia Type: General Level of consciousness: awake and alert Pain management: pain level controlled Vital Signs Assessment: post-procedure vital signs reviewed and stable Respiratory status: spontaneous breathing, nonlabored ventilation, respiratory function stable and patient connected to nasal cannula oxygen Cardiovascular status: blood pressure returned to baseline and stable Postop Assessment: no apparent nausea or vomiting Anesthetic complications: no    Last Vitals:  Vitals:   05/27/18 1846 05/27/18 1849  BP: (!) 105/43 (!) 97/53  Pulse:    Resp:    Temp: 36.5 C   SpO2:      Last Pain:  Vitals:   05/27/18 1849  PainSc: 3                  [name]

## 2018-05-29 ENCOUNTER — Encounter (HOSPITAL_COMMUNITY): Payer: Self-pay | Admitting: Otolaryngology

## 2018-09-05 ENCOUNTER — Ambulatory Visit (INDEPENDENT_AMBULATORY_CARE_PROVIDER_SITE_OTHER): Payer: Medicare Other | Admitting: Neurology

## 2018-09-05 ENCOUNTER — Encounter: Payer: Self-pay | Admitting: Neurology

## 2018-09-05 ENCOUNTER — Other Ambulatory Visit: Payer: Self-pay

## 2018-09-05 VITALS — BP 118/66 | HR 90 | Resp 18 | Ht 78.0 in | Wt 352.0 lb

## 2018-09-05 DIAGNOSIS — T754XXA Electrocution, initial encounter: Secondary | ICD-10-CM | POA: Diagnosis not present

## 2018-09-05 DIAGNOSIS — R748 Abnormal levels of other serum enzymes: Secondary | ICD-10-CM
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
## 2018-09-06 ENCOUNTER — Telehealth: Payer: Self-pay | Admitting: Neurology

## 2018-09-06 ENCOUNTER — Encounter (HOSPITAL_BASED_OUTPATIENT_CLINIC_OR_DEPARTMENT_OTHER): Payer: Medicare Other | Admitting: Neurology

## 2018-09-06 DIAGNOSIS — Z96652 Presence of left artificial knee joint: Secondary | ICD-10-CM

## 2018-09-06 DIAGNOSIS — G629 Polyneuropathy, unspecified: Secondary | ICD-10-CM

## 2018-09-06 NOTE — Telephone Encounter (Signed)
Kristie @ [facility] has called asking if Dr Anne Hahn will give verbal orders for Skilled Nursing, please call Danford Bad back @  [phone]
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
## 2018-09-07 NOTE — Telephone Encounter (Signed)
Pts mother Veronica(on DPR) requesting a call stating that per the pts PCP his CK levels were elevated and recommenced the pt going to the ED. Suzette Battiest did not wish to discuss further at this time with me but would like a call back from RN as son as possible

## 2018-09-08 NOTE — Telephone Encounter (Signed)
Left message with Jevonte for mother to call back/fim

## 2018-09-12 NOTE — Telephone Encounter (Signed)
Spoke with pt's mother. She will call back in 3 wks. and will order recheck CK/fim

## 2018-09-25 ENCOUNTER — Telehealth: Payer: Self-pay | Admitting: Neurology

## 2018-09-25 DIAGNOSIS — R748 Abnormal levels of other serum enzymes: Secondary | ICD-10-CM

## 2018-09-25 NOTE — Telephone Encounter (Signed)
I called, left a message.  Patient will need to come in sometime in the next several days to check the CK enzyme level, I will place the order.

## 2018-10-28 ENCOUNTER — Telehealth: Payer: Self-pay | Admitting: Neurology

## 2018-10-28 NOTE — Telephone Encounter (Signed)
Received CK results. Pt had his CK checked through Dr. Arther DamesBarrino's office on 10/13/2018 and it was 362. I will send to Dr. Anne HahnWillis for further review. Pt's mother reports that pt is still having muscle spasms.

## 2018-10-28 NOTE — Telephone Encounter (Signed)
I have not received any labs yet. Pt would need to have had a CK checked recently through PCP. Otherwise, he does need to come in for this lab to be checked. Will look for labs again later on the fax.

## 2018-10-28 NOTE — Telephone Encounter (Addendum)
Pt's mother Rollene FareVeronica/DPR is going to fax blood work from PCP to [phone], she was thinking it was already faxed from PCP.  Patient is having muscle spasms. PT advised he needs to see Dr Anne HahnWillis. Please call to advise if he needs an appointment at [phone] or labs

## 2018-10-28 NOTE — Telephone Encounter (Signed)
I called pt's mother Suzette BattiestVeronica, per DPR. She has not faxed labwork from his PCP yet. She is faxing it directly to me. I advised her that if CK enzyme was not checked, pt will need to come in for repeat CK level. Pt's mother verbalized understanding.

## 2018-10-30 NOTE — Telephone Encounter (Signed)
I called the mother concerning the patient.  He still has a modest elevation of the muscle enzyme level, 362.  He apparently is having muscle spasms which may elevate the muscle enzyme levels, if the spasms are frequent, they are to contact our office.  Clinically, he had no weakness, I am not overly concerned about an underlying myopathy.

## 2018-12-15 NOTE — Telephone Encounter (Signed)
error 

## 2019-12-29 ENCOUNTER — Emergency Department (HOSPITAL_COMMUNITY)
Admission: EM | Admit: 2019-12-29 | Discharge: 2019-12-29 | Disposition: A | Discharge status: Home / Self Care | Payer: Medicare Other | Attending: Emergency Medicine | Admitting: Emergency Medicine

## 2019-12-29 ENCOUNTER — Other Ambulatory Visit: Payer: Self-pay

## 2019-12-29 ENCOUNTER — Emergency Department (HOSPITAL_COMMUNITY): Payer: Medicare Other

## 2019-12-29 ENCOUNTER — Encounter (HOSPITAL_COMMUNITY): Payer: Self-pay | Admitting: *Deleted

## 2019-12-29 DIAGNOSIS — I10 Essential (primary) hypertension: Secondary | ICD-10-CM | POA: Insufficient documentation

## 2019-12-29 DIAGNOSIS — R42 Dizziness and giddiness: Secondary | ICD-10-CM | POA: Insufficient documentation

## 2019-12-29 DIAGNOSIS — Z79899 Other long term (current) drug therapy: Secondary | ICD-10-CM | POA: Insufficient documentation

## 2019-12-29 LAB — CBC WITH DIFFERENTIAL/PLATELET
Abs Immature Granulocytes: 0.02 10*3/uL (ref 0.00–0.07)
Basophils Absolute: 0 10*3/uL (ref 0.0–0.1)
Basophils Relative: 1 %
Eosinophils Absolute: 0.1 10*3/uL (ref 0.0–0.5)
Eosinophils Relative: 1 %
HCT: 47.1 % (ref 39.0–52.0)
Hemoglobin: 15 g/dL (ref 13.0–17.0)
Immature Granulocytes: 0 %
Lymphocytes Relative: 35 %
Lymphs Abs: 2.9 10*3/uL (ref 0.7–4.0)
MCH: 29.5 pg (ref 26.0–34.0)
MCHC: 31.8 g/dL (ref 30.0–36.0)
MCV: 92.5 fL (ref 80.0–100.0)
Monocytes Absolute: 0.4 10*3/uL (ref 0.1–1.0)
Monocytes Relative: 5 %
Neutro Abs: 4.6 10*3/uL (ref 1.7–7.7)
Neutrophils Relative %: 58 %
Platelets: 228 10*3/uL (ref 150–400)
RBC: 5.09 MIL/uL (ref 4.22–5.81)
RDW: 12.5 % (ref 11.5–15.5)
WBC: 8.1 10*3/uL (ref 4.0–10.5)
nRBC: 0 % (ref 0.0–0.2)

## 2019-12-29 LAB — URINALYSIS, ROUTINE W REFLEX MICROSCOPIC
Bilirubin Urine: NEGATIVE
Glucose, UA: NEGATIVE mg/dL
Hgb urine dipstick: NEGATIVE
Ketones, ur: NEGATIVE mg/dL
Leukocytes,Ua: NEGATIVE
Nitrite: NEGATIVE
Protein, ur: NEGATIVE mg/dL
Specific Gravity, Urine: 1.025 (ref 1.005–1.030)
pH: 5 (ref 5.0–8.0)

## 2019-12-29 LAB — BASIC METABOLIC PANEL
Anion gap: 8 (ref 5–15)
BUN: 11 mg/dL (ref 6–20)
CO2: 23 mmol/L (ref 22–32)
Calcium: 8.8 mg/dL — ABNORMAL LOW (ref 8.9–10.3)
Chloride: 108 mmol/L (ref 98–111)
Creatinine, Ser: 0.93 mg/dL (ref 0.61–1.24)
GFR calc Af Amer: >60 mL/min (ref >60)
GFR calc non Af Amer: >60 mL/min (ref >60)
Glucose, Bld: 96 mg/dL (ref 70–99)
Potassium: 4.2 mmol/L (ref 3.5–5.1)
Sodium: 139 mmol/L (ref 135–145)

## 2019-12-29 LAB — CBG MONITORING, ED: Glucose-Capillary: 87 mg/dL (ref 70–99)

## 2019-12-29 MED ORDER — SODIUM CHLORIDE 0.9 % IV BOLUS
1000.0000 mL | Freq: Once | INTRAVENOUS | Status: AC
  Administered 2019-12-29: 20:00:00 1000 mL via INTRAVENOUS

## 2019-12-29 MED ORDER — ACETAMINOPHEN 325 MG PO TABS
650.0000 mg | ORAL_TABLET | Freq: Once | ORAL | Status: AC
  Administered 2019-12-29: 650 mg via ORAL
  Filled 2019-12-29: qty 2

## 2019-12-29 NOTE — ED Provider Notes (Signed)
[facility] EMERGENCY DEPARTMENT Provider Note   CSN: [id number] Arrival date & time: 12/29/19  1833     History Chief Complaint  Patient presents with  . Dizziness    Johnny Hayes is a 49 y.o. male with history of "developmental delay", hypertension, sleep apnea, childhood seizures.  Patient is fully alert and oriented, he presents with his mother today but this patient lives alone and normally takes care of himself without assistance of others.  Patient presents today for lightheadedness and has been intermittent for the past 2 days.  Patient reports that 2 days ago he was outside when he stood up and became lightheaded, he had relative help him into the home where he laid down on the couch and after a few moments began to feel better.  He denies any associated chest pain or shortness of breath.  He reports that this is never happened to him before.  He reports that he felt well yesterday however today he was riding in the car with his mother when he stood up out of the car and again felt lightheaded as if he was going to pass out.  Patient sat back down and felt better after several seconds.  They then drove to his mother's house so they could check his blood pressure and when he tried to get out of the car there he again got lightheaded and had to sit down.  He reports that since that time he has had no recurrence of symptoms.  Patient reports that with his most recent episode of lightheadedness that he felt as if his right hand was "cold".  His mother who is at bedside reports that his right hand did feel cold to touch to her.  He denies any recent illness, fever/chills, headache, vision changes, speech difficulty, neck pain, chest pain/shortness of breath, abdominal pain, nausea/vomiting, diarrhea, numbness/weakness, tingling or any additional concerns. HPI     Past Medical History:  Diagnosis Date  . Development delay    "no longer takes depakote"  . Hypertension   .  Osteoarthritis of knee    left  . Seizures (HCC)    Sz. as a child. Last sz. at age 25, per mother  . Sleep apnea     Patient Active Problem List   Diagnosis Date Noted  . 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 DIRECT LARYNGOSCOPY N/A 05/27/2018   Procedure: DIRECT LARYNGOSCOPY and ESOPHAGOSCOPY;  Surgeon: Flo Shanks, MD;  Location: [facility] OR;  Service: ENT;  Laterality: N/A;  . KNEE ARTHROPLASTY Left 03/20/2015   Procedure: COMPUTER ASSISTED TOTAL KNEE ARTHROPLASTY;  Surgeon: Eldred Manges, MD;  Location: [facility] OR;  Service: Orthopedics;  Laterality: Left;  . KNEE ARTHROSCOPY W/ DEBRIDEMENT Left 2001       Family History  Problem Relation Age of Onset  . Fibromyalgia Mother   . Peripheral vascular disease Father   . COPD Father   . Migraines Sister   . Hypertension Sister     Social History   Tobacco Use  . Smoking status: Never Smoker  . Smokeless tobacco: Never Used  Substance Use Topics  . Alcohol use: No  . Drug use: No    Home Medications Prior to Admission medications   Medication Sig Start Date End Date Taking? Authorizing Provider  amLODipine (NORVASC) 5 MG tablet Take 5 mg by mouth daily. 09/01/18  Yes [provider]  baclofen (LIORESAL) 10 MG  tablet Take 10 mg by mouth 3 (three) times daily as needed. 11/28/19  Yes [provider]  naproxen sodium (ALEVE) 220 MG tablet Take 220-440 mg by mouth 2 (two) times daily as needed (pain).   Yes [provider]    Allergies    Penicillins  Review of Systems   Review of Systems Ten systems are reviewed and are negative for acute change except as noted in the HPI  Physical Exam Updated Vital Signs BP 124/86   Pulse 64   Temp 98.2 F (36.8 C) (Oral)   Resp 16   Ht 6' 6.5" (1.994 m)   Wt (!) 162.8 kg   SpO2 98%   BMI 40.96 kg/m   Physical Exam Constitutional:      General: He is not in acute distress.     Appearance: Normal appearance. He is well-developed. He is not ill-appearing or diaphoretic.  HENT:     Head: Normocephalic and atraumatic.     Right Ear: External ear normal.     Left Ear: External ear normal.     Nose: Nose normal.  Eyes:     General: Vision grossly intact. Gaze aligned appropriately.     Pupils: Pupils are equal, round, and reactive to light.  Neck:     Trachea: Trachea and phonation normal. No tracheal deviation.  Cardiovascular:     Rate and Rhythm: Normal rate and regular rhythm.     Heart sounds: Normal heart sounds.  Pulmonary:     Effort: Pulmonary effort is normal. No accessory muscle usage or respiratory distress.     Breath sounds: Normal breath sounds and air entry.  Abdominal:     General: There is no distension.     Palpations: Abdomen is soft.     Tenderness: There is no abdominal tenderness. There is no guarding or rebound.  Musculoskeletal:        General: Normal range of motion.     Cervical back: Normal range of motion.  Skin:    General: Skin is warm and dry.  Neurological:     Mental Status: He is alert.     GCS: GCS eye subscore is 4. GCS verbal subscore is 5. GCS motor subscore is 6.     Comments: Mental Status: Alert, oriented, thought content appropriate, able to give a coherent history. Speech fluent without evidence of aphasia. Able to follow 2 step commands without difficulty. Cranial Nerves: II: Peripheral visual fields grossly normal, pupils equal, round, reactive to light  III,IV, VI: Left exotropia, extra-ocular motions intact bilaterally V,VII: smile symmetric, eyebrows raise symmetric, facial light touch sensation equal VIII: hearing grossly normal to voice X: uvula elevates symmetrically XI: bilateral shoulder shrug symmetric and strong XII: midline tongue extension without fassiculations Motor: Normal tone. 5/5 strength in upper and lower extremities bilaterally including strong and equal grip strength and  dorsiflexion/plantar flexion Sensory: Sensation intact to light touch in all extremities.Negative Romberg.  Cerebellar: normal finger-to-nose with bilateral upper extremities. Normal heel-to -shin balance bilaterally of the lower extremity. No pronator drift.  CV: distal pulses palpable throughout  Psychiatric:        Behavior: Behavior normal.     ED Results / Procedures / Treatments   Labs (all labs ordered are listed, but only abnormal results are displayed) Labs Reviewed  BASIC METABOLIC PANEL - Abnormal; Notable for the following components:      Result Value   Calcium 8.8 (*)    All other components within normal limits  URINALYSIS, ROUTINE W REFLEX MICROSCOPIC  CBC WITH DIFFERENTIAL/PLATELET  CBC WITH DIFFERENTIAL/PLATELET  CBG MONITORING, ED    EKG EKG Interpretation  Date/Time:  Friday December 29 2019 18:59:30 EST Ventricular Rate:  80 PR Interval:  162 QRS Duration: 118 QT Interval:  382 QTC Calculation: 440 R Axis:   55 Text Interpretation: Normal sinus rhythm Non-specific intra-ventricular conduction delay T wave abnormality, consider inferior ischemia Abnormal ECG Since last tracing rate slower Confirmed by Eber Hong ([id number]) on 12/29/2019 7:04:27 PM   Radiology DG Chest Portable 1 View  Result Date: 12/29/2019 CLINICAL DATA:  Dizziness EXAM: PORTABLE CHEST 1 VIEW COMPARISON:  03/06/2015 FINDINGS: The heart size and mediastinal contours are within normal limits. Both lungs are clear. The visualized skeletal structures are unremarkable. IMPRESSION: No acute abnormality of the lungs in AP portable projection. Electronically Signed   By: Lauralyn Primes M.D.   On: 12/29/2019 19:55    Procedures Procedures (including critical care time)  Medications Ordered in ED Medications  sodium chloride 0.9 % bolus 1,000 mL (1,000 mLs Intravenous New Bag/Given 12/29/19 1953)  acetaminophen (TYLENOL) tablet 650 mg (650 mg Oral Given 12/29/19 2003)    ED Course  I have  reviewed the triage vital signs and the nursing notes.  Pertinent labs & imaging results that were available during my care of the patient were reviewed by me and considered in my medical decision making (see chart for details).    MDM Rules/Calculators/A&P                     49 year old male with history as detailed above presents today for lightheadedness upon standing that has been intermittent over the past 2 days.  He has no associated chest pain or shortness of breath, he denies any dizziness or neurologic complaints.  He is well-appearing no acute distress on exam, cranial nerves intact, no meningeal signs, heart and lungs clear, abdomen soft nontender without peritoneal signs, neurovascular intact to all 4 extremities without evidence of DVT.  He became somewhat lightheaded with standing today during examination but had negative Romberg.  Will give fluid bolus and obtain basic labs, EKG and chest x-ray and reassess.  Suspect possible orthostatic hypotension, he will be monitored throughout visit today.  No negation for imaging of the head at this time, no evidence for CVA. - CBC within normal limits no evidence of anemia and no leukocytosis to suggest infection Urinalysis for evidence of infection CBG 87 BMP calcium 8.8 otherwise within normal limits no acute electrolyte abnormality or kidney injury Chest x-ray:  IMPRESSION:  No acute abnormality of the lungs in AP portable projection.   I have personally reviewed patient's chest x-ray and agree with radiologist interpretation.  EKG: Normal sinus rhythm Non-specific intra-ventricular conduction delay T wave abnormality, consider inferior ischemia Abnormal ECG Since last tracing rate slower Confirmed by Eber Hong ([id number]) on 12/29/2019 7:04:27 PM ---  Orthostatic vital signs obtained, no hypotension, blood pressure slightly elevated throughout.  RN reports patient had reported brief blurry vision with positional change that resolved.   Patient reassessed resting comfortably in bed no acute distress vital signs stable.  No evidence of arrhythmia, anemia, dissection or other acute cardiopulmonary etiology of his symptoms today, additionally no evidence of acute CNS process.  Will encourage patient to increase hydration and oral intake at home and follow-up with PCP.  No negation for further work-up in the ED today.  On reevaluation patient resting comfortably no acute distress, no recurrent symptoms  since receiving fluid bolus, vital signs stable.  Discussed plan of care they are agreeable to discharge she will be discharged and plans to stay the weekend with his mother until he sees primary care provider.  At this time there does not appear to be any evidence of an acute emergency medical condition and the patient appears stable for discharge with appropriate outpatient follow up. Diagnosis was discussed with patient who verbalizes understanding of care plan and is agreeable to discharge. I have discussed return precautions with patient and mother who verbalizes understanding of return precautions. Patient encouraged to follow-up with their PCP. All questions answered.  Patient was seen and evaluated by Dr. Sabra Heck during this visit who agrees with discharge and PCP follow-up.  Note: Portions of this report may have been transcribed using voice recognition software. Every effort was made to ensure accuracy; however, inadvertent computerized transcription errors may still be present. Final Clinical Impression(s) / ED Diagnoses Final diagnoses:  Lightheadedness    Rx / DC Orders ED Discharge Orders    None       Gari Crown 12/29/19 2241    Noemi Chapel, MD 12/30/19 [phone]

## 2019-12-29 NOTE — ED Triage Notes (Signed)
Pt with dizziness since yesterday comes and goes.  Pt denies any pain or sob.

## 2019-12-29 NOTE — Discharge Instructions (Addendum)
You have been diagnosed today with Lightheadedness.  At this time there does not appear to be the presence of an emergent medical condition, however there is always the potential for conditions to change. Please read and follow the below instructions.  Please return to the Emergency Department immediately for any new or worsening symptoms. Please be sure to follow up with your Primary Care Provider within one week regarding your visit today; please call their office to schedule an appointment even if you are feeling better for a follow-up visit. Please drink plenty of water to avoid dehydration and get plenty of rest.  Please call your primary care provider tomorrow to schedule a follow-up appointment.  Get help right away if: You throw up (vomit) or have watery poop (diarrhea), and you cannot eat or drink anything. You have trouble: Talking. Walking. Swallowing. Using your arms, hands, or legs. You feel generally weak. You are not thinking clearly, or you have trouble forming sentences. A friend or family member may notice this. You have: Chest pain. Pain in your belly (abdomen). Shortness of breath. Sweating. Your vision changes. You are bleeding. You have a very bad headache. You have neck pain or a stiff neck. You have a fever. You have any new/concerning or worsening of symptoms  Please read the additional information packets attached to your discharge summary.  Do not take your medicine if  develop an itchy rash, swelling in your mouth or lips, or difficulty breathing; call 911 and seek immediate emergency medical attention if this occurs.  Note: Portions of this text may have been transcribed using voice recognition software. Every effort was made to ensure accuracy; however, inadvertent computerized transcription errors may still be present.

## 2019-12-29 NOTE — ED Provider Notes (Signed)
This patient is a well-appearing 49 year old male presenting with a couple episodes of lightheadedness.  On exam this patient has a clear heart and lung sounds, there is no tachycardia, he does get lightheaded when he stands up.  He has no edema of his legs and follows commands without difficulty, he does have a disconjugate gaze at baseline.  Otherwise follows commands without difficulty and has a mild headache but no other complaints.  Vital signs reflect mild hypertension, he does become symptomatic when he stands but no significant hypotension or tachycardia.  EKG is unremarkable showing right bundle branch block without ischemia or arrhythmia  Labs pending, anticipate discharge if negative.  Medical screening examination/treatment/procedure(s) were conducted as a shared visit with non-physician practitioner(s) and myself.  I personally evaluated the patient during the encounter.  Clinical Impression:   Final diagnoses:  Johnny Bellis, MD 12/30/19 1626

## 2020-01-12 ENCOUNTER — Other Ambulatory Visit: Payer: Self-pay | Admitting: Family Medicine

## 2020-01-12 DIAGNOSIS — G453 Amaurosis fugax: Secondary | ICD-10-CM

## 2020-01-12 DIAGNOSIS — R42 Dizziness and giddiness: Secondary | ICD-10-CM

## 2020-02-07 ENCOUNTER — Ambulatory Visit
Admission: RE | Admit: 2020-02-07 | Discharge: 2020-02-07 | Disposition: A | Discharge status: Home / Self Care | Payer: Medicare Other | Source: Ambulatory Visit | Attending: Family Medicine | Admitting: Family Medicine

## 2020-02-07 DIAGNOSIS — G453 Amaurosis fugax: Secondary | ICD-10-CM

## 2020-02-07 DIAGNOSIS — R42 Dizziness and giddiness: Secondary | ICD-10-CM

## 2020-02-15 DIAGNOSIS — R6250 Unspecified lack of expected normal physiological development in childhood: Secondary | ICD-10-CM | POA: Insufficient documentation

## 2020-02-27 ENCOUNTER — Ambulatory Visit
Admission: RE | Admit: 2020-02-27 | Discharge: 2020-02-27 | Disposition: A | Discharge status: Home / Self Care | Payer: Medicare Other | Source: Ambulatory Visit | Attending: Family Medicine | Admitting: Family Medicine

## 2020-02-27 ENCOUNTER — Other Ambulatory Visit: Payer: Self-pay

## 2020-02-27 DIAGNOSIS — G453 Amaurosis fugax: Secondary | ICD-10-CM

## 2020-02-27 DIAGNOSIS — R42 Dizziness and giddiness: Secondary | ICD-10-CM

## 2020-02-27 MED ORDER — GADOBENATE DIMEGLUMINE 529 MG/ML IV SOLN
20.0000 mL | Freq: Once | INTRAVENOUS | Status: AC | PRN
  Administered 2020-02-27: 11:00:00 20 mL via INTRAVENOUS

## 2020-03-11 ENCOUNTER — Other Ambulatory Visit: Payer: Medicare Other

## 2021-03-10 NOTE — Progress Notes (Signed)
Error  Error  error

## 2021-05-30 NOTE — Procedures (Signed)
error 

## 2022-04-22 IMAGING — MR MR HEAD WO/W CM
12 series · 48 of 48 positions shown · IV contrast (20ml Multihance)
Comparison: None.

CLINICAL DATA: Dizziness.  Amaurosis fugax on the left.

EXAM:
MRI HEAD WITHOUT AND WITH CONTRAST
TECHNIQUE: Multiplanar, multiecho pulse sequences of the brain and surrounding
structures were obtained without and with intravenous contrast.
CONTRAST:  20mL MULTIHANCE GADOBENATE DIMEGLUMINE 529 MG/ML IV SOLN

[Series 2: t1_se_sag · sagittal · 5.0mm · 0.47mm/px · 2 of 21 slices shown]
[im 1/21]
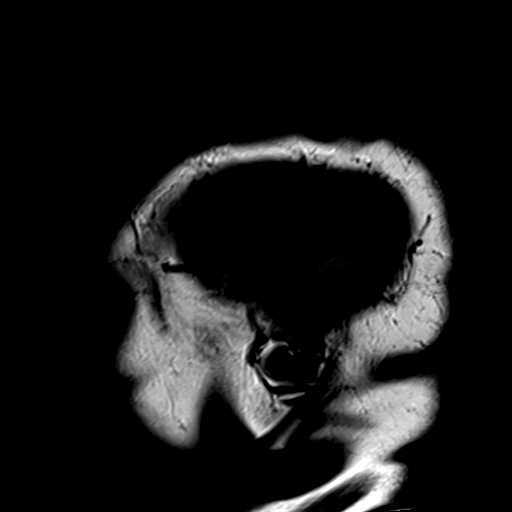
[im 21/21]
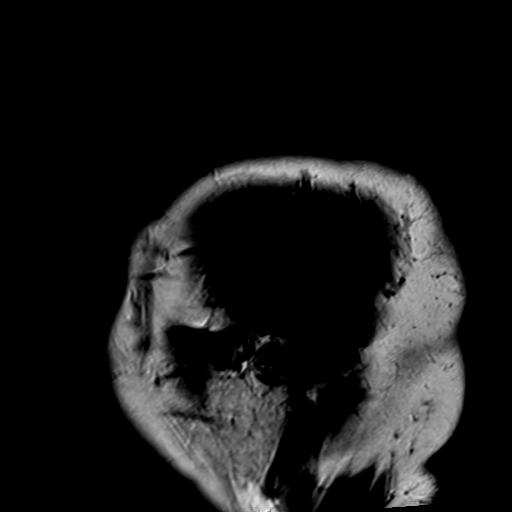

[Series 3: ep2d_diff_3 · axial · 3.0mm · 1.88mm/px · z∈[-12,+136]mm · 6 of 100 slices shown]
[im 1/100]
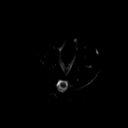
[im 20/100]
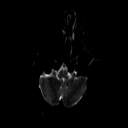
[im 40/100]
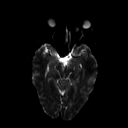
[im 60/100]
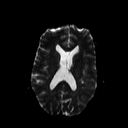
[im 80/100]
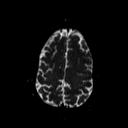
[im 100/100]
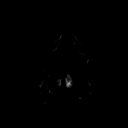

[Series 4: ep2d_diff_3_adc · axial · 3.0mm · 1.88mm/px · z∈[-12,+136]mm · 3 of 51 slices shown]
[im 1/51]
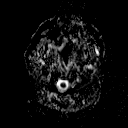
[im 26/51]
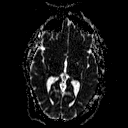
[im 51/51]
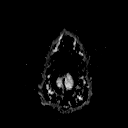

[Series 5: ep2d_diff_cor · coronal · 5.0mm · 1.77mm/px · 4 of 54 slices shown]
[im 1/54]
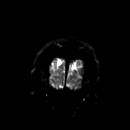
[im 18/54]
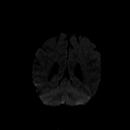
[im 36/54]
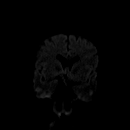
[im 54/54]
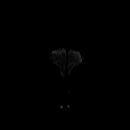

[Series 6: ep2d_diff_cor_adc · coronal · 5.0mm · 1.77mm/px · 2 of 27 slices shown]
[im 1/27]
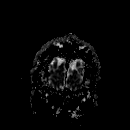
[im 27/27]
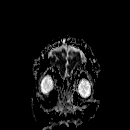

[Series 7: FLAIR · axial · 3.0mm · 0.47mm/px · z∈[-28,+152]mm · 2 of 32 slices shown]
[im 1/32]
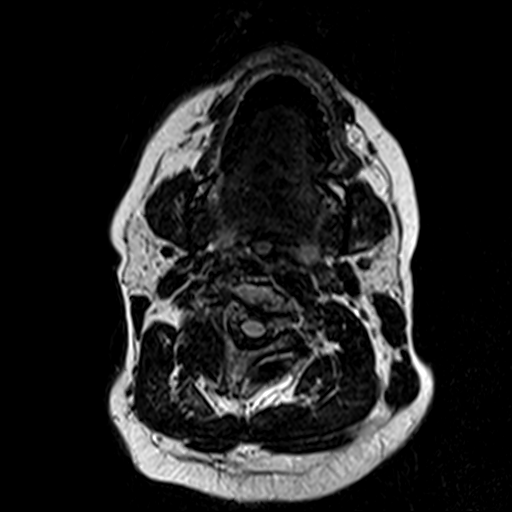
[im 32/32]
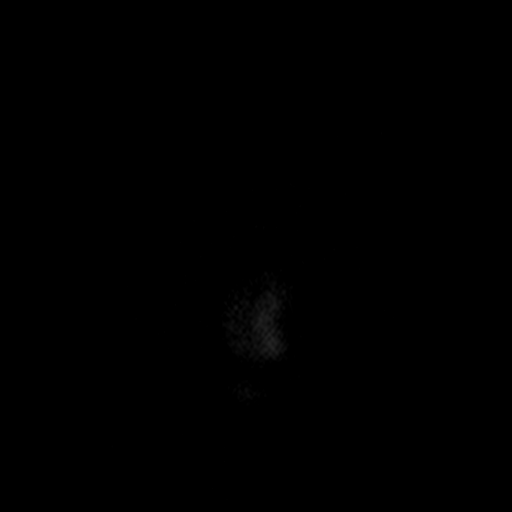

[Series 8: t2_tse_tra · axial · 5.0mm · 0.60mm/px · z∈[-16,+129]mm · 2 of 26 slices shown]
[im 1/26]
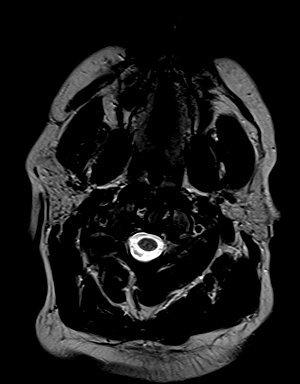
[im 26/26]
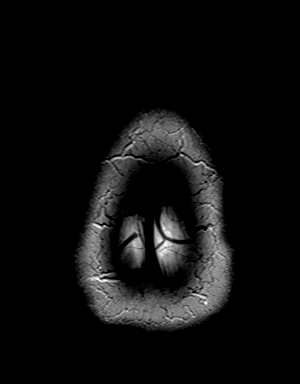

[Series 10: swi_images · axial · 2.0mm · 0.90mm/px · z∈[-20,+133]mm · 5 of 80 slices shown]
[im 1/80]
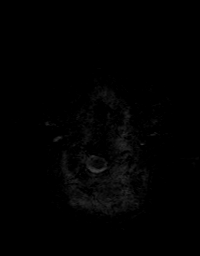
[im 20/80]
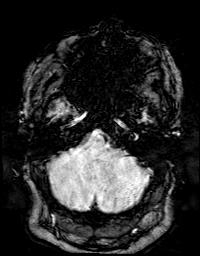
[im 40/80]
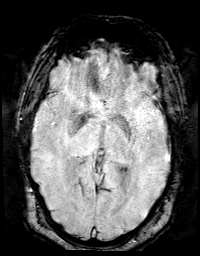
[im 60/80]
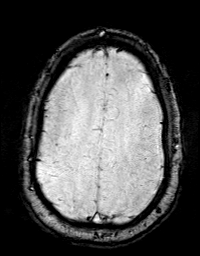
[im 80/80]
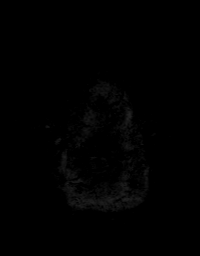

[Series 11: t1_mpr_tra · axial · 1.0mm · 0.72mm/px · z∈[-13,+126]mm · 9 of 144 slices shown]
[im 1/144]
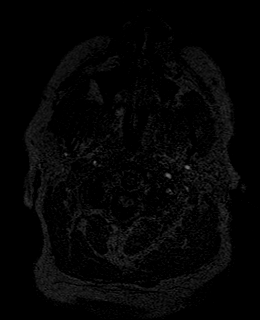
[im 18/144]
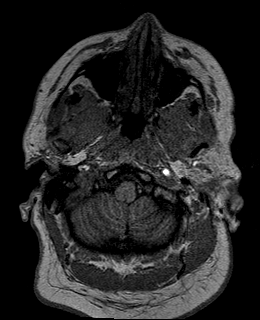
[im 36/144]
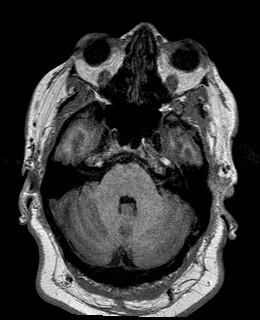
[im 54/144]
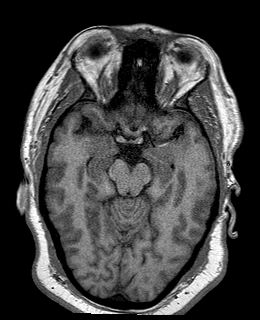
[im 72/144]
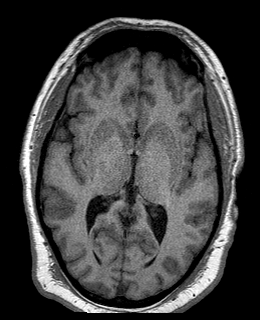
[im 90/144]
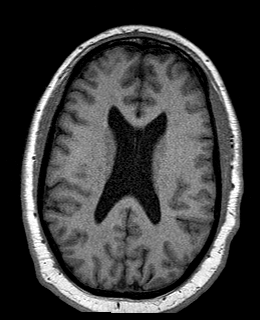
[im 108/144]
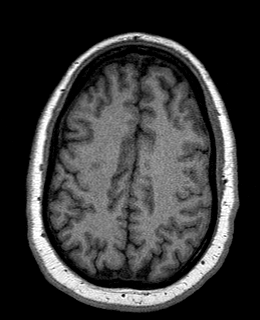
[im 126/144]
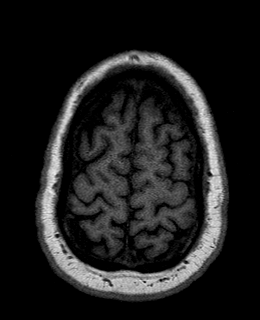
[im 144/144]
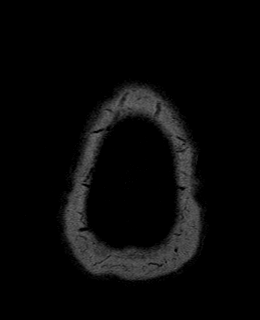

[Series 12: T2 post-contrast · coronal · 5.0mm · 0.45mm/px · 2 of 30 slices shown]
[im 1/30]
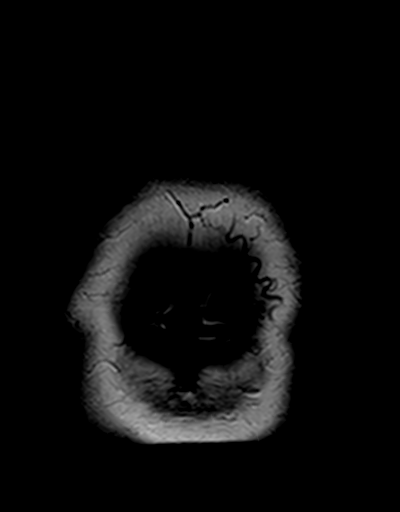
[im 30/30]
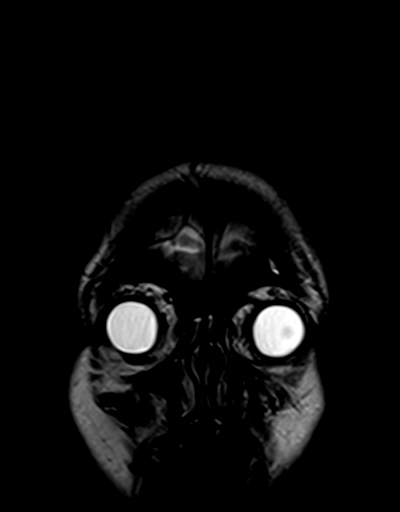

[Series 13: post t1_mpr_tra · axial · 1.0mm · 0.72mm/px · z∈[-13,+126]mm · 9 of 144 slices shown]
[im 1/144]
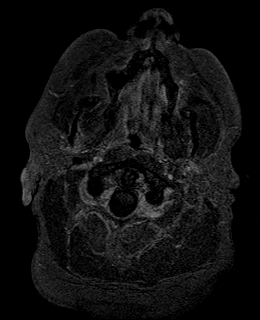
[im 18/144]
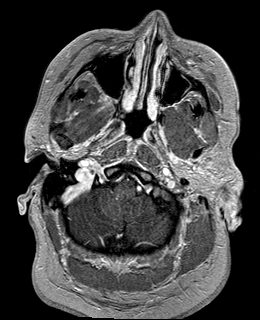
[im 36/144]
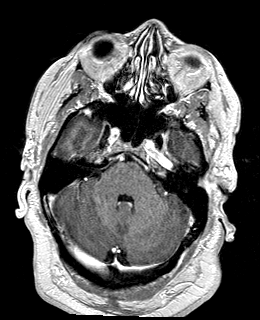
[im 54/144]
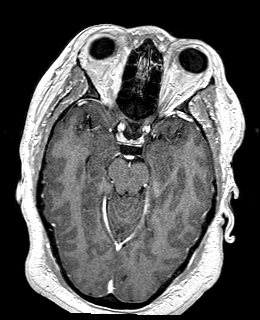
[im 72/144]
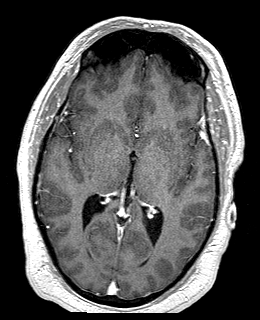
[im 90/144]
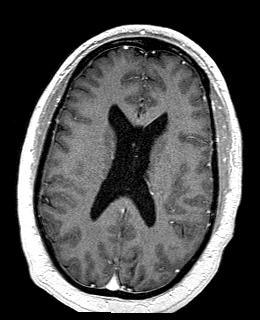
[im 108/144]
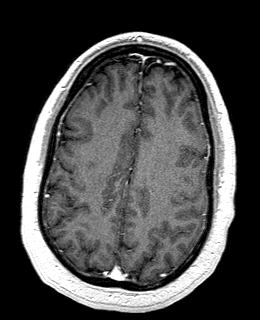
[im 126/144]
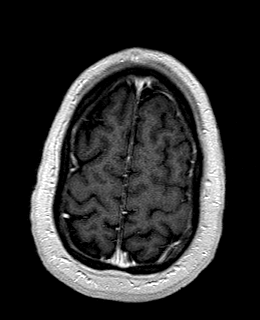
[im 144/144]
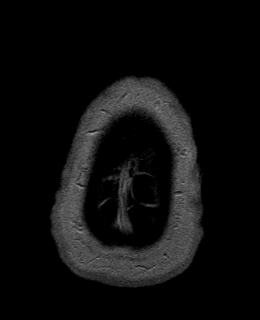

[Series 14: T1 post-contrast · coronal · 5.0mm · 0.72mm/px · 2 of 30 slices shown]
[im 1/30]
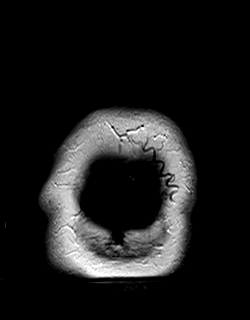
[im 30/30]
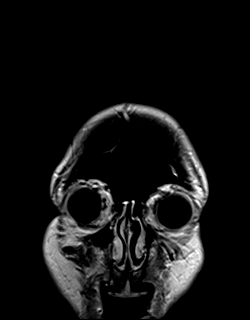

[48 of 48 positions shown; findings below may reference images not displayed]

FINDINGS: Brain: No infarction, hemorrhage, hydrocephalus, extra-axial
collection or mass lesion. Rare FLAIR hyperintensities in the
cerebral white matter, age congruent. Normal brain volume. No
abnormal intracranial enhancement

Vascular: Normal flow voids, including in the covered left ICA.

Skull and upper cervical spine: Normal marrow signal

Sinuses/Orbits: Nonspecific dysconjugate gaze on axial T2 weighted
imaging. No evidence of mass or inflammation.
IMPRESSION: Negative brain MRI.

## 2022-09-18 DIAGNOSIS — E1140 Type 2 diabetes mellitus with diabetic neuropathy, unspecified: Secondary | ICD-10-CM | POA: Insufficient documentation

## 2022-09-18 DIAGNOSIS — E785 Hyperlipidemia, unspecified: Secondary | ICD-10-CM | POA: Insufficient documentation

## 2022-09-22 DIAGNOSIS — E538 Deficiency of other specified B group vitamins: Secondary | ICD-10-CM | POA: Insufficient documentation

## 2022-11-02 DIAGNOSIS — G4733 Obstructive sleep apnea (adult) (pediatric): Secondary | ICD-10-CM | POA: Diagnosis not present

## 2022-12-28 DIAGNOSIS — G4733 Obstructive sleep apnea (adult) (pediatric): Secondary | ICD-10-CM | POA: Diagnosis not present

## 2022-12-29 DIAGNOSIS — G4733 Obstructive sleep apnea (adult) (pediatric): Secondary | ICD-10-CM | POA: Diagnosis not present

## 2023-01-26 DIAGNOSIS — Z1211 Encounter for screening for malignant neoplasm of colon: Secondary | ICD-10-CM | POA: Diagnosis not present

## 2023-02-01 DIAGNOSIS — G4733 Obstructive sleep apnea (adult) (pediatric): Secondary | ICD-10-CM | POA: Diagnosis not present

## 2023-03-01 DIAGNOSIS — E785 Hyperlipidemia, unspecified: Secondary | ICD-10-CM | POA: Diagnosis not present

## 2023-03-01 DIAGNOSIS — K5730 Diverticulosis of large intestine without perforation or abscess without bleeding: Secondary | ICD-10-CM | POA: Diagnosis not present

## 2023-03-01 DIAGNOSIS — K635 Polyp of colon: Secondary | ICD-10-CM | POA: Diagnosis not present

## 2023-03-01 DIAGNOSIS — D128 Benign neoplasm of rectum: Secondary | ICD-10-CM | POA: Diagnosis not present

## 2023-03-01 DIAGNOSIS — Z7984 Long term (current) use of oral hypoglycemic drugs: Secondary | ICD-10-CM | POA: Diagnosis not present

## 2023-03-01 DIAGNOSIS — E119 Type 2 diabetes mellitus without complications: Secondary | ICD-10-CM | POA: Diagnosis not present

## 2023-03-01 DIAGNOSIS — Z880 Allergy status to penicillin: Secondary | ICD-10-CM | POA: Diagnosis not present

## 2023-03-01 DIAGNOSIS — I10 Essential (primary) hypertension: Secondary | ICD-10-CM | POA: Diagnosis not present

## 2023-03-01 DIAGNOSIS — G4730 Sleep apnea, unspecified: Secondary | ICD-10-CM | POA: Diagnosis not present

## 2023-03-01 DIAGNOSIS — G4733 Obstructive sleep apnea (adult) (pediatric): Secondary | ICD-10-CM | POA: Diagnosis not present

## 2023-03-01 DIAGNOSIS — F17210 Nicotine dependence, cigarettes, uncomplicated: Secondary | ICD-10-CM | POA: Diagnosis not present

## 2023-03-01 DIAGNOSIS — Z1211 Encounter for screening for malignant neoplasm of colon: Secondary | ICD-10-CM | POA: Diagnosis not present

## 2023-03-16 DIAGNOSIS — D128 Benign neoplasm of rectum: Secondary | ICD-10-CM | POA: Diagnosis not present

## 2023-03-28 DIAGNOSIS — G4733 Obstructive sleep apnea (adult) (pediatric): Secondary | ICD-10-CM | POA: Diagnosis not present

## 2023-04-05 DIAGNOSIS — G4733 Obstructive sleep apnea (adult) (pediatric): Secondary | ICD-10-CM | POA: Diagnosis not present

## 2023-04-21 DIAGNOSIS — E782 Mixed hyperlipidemia: Secondary | ICD-10-CM | POA: Diagnosis not present

## 2023-04-21 DIAGNOSIS — I10 Essential (primary) hypertension: Secondary | ICD-10-CM | POA: Diagnosis not present

## 2023-04-21 DIAGNOSIS — R0609 Other forms of dyspnea: Secondary | ICD-10-CM | POA: Diagnosis not present

## 2023-04-27 DIAGNOSIS — I10 Essential (primary) hypertension: Secondary | ICD-10-CM | POA: Diagnosis not present

## 2023-04-27 DIAGNOSIS — E782 Mixed hyperlipidemia: Secondary | ICD-10-CM | POA: Diagnosis not present

## 2023-04-27 DIAGNOSIS — Z0000 Encounter for general adult medical examination without abnormal findings: Secondary | ICD-10-CM | POA: Diagnosis not present

## 2023-04-27 DIAGNOSIS — R7302 Impaired glucose tolerance (oral): Secondary | ICD-10-CM | POA: Diagnosis not present

## 2023-04-27 DIAGNOSIS — E785 Hyperlipidemia, unspecified: Secondary | ICD-10-CM | POA: Diagnosis not present

## 2023-04-27 DIAGNOSIS — E119 Type 2 diabetes mellitus without complications: Secondary | ICD-10-CM | POA: Diagnosis not present

## 2023-05-13 DIAGNOSIS — E785 Hyperlipidemia, unspecified: Secondary | ICD-10-CM | POA: Diagnosis not present

## 2023-05-13 DIAGNOSIS — I4821 Permanent atrial fibrillation: Secondary | ICD-10-CM | POA: Diagnosis not present

## 2023-05-13 DIAGNOSIS — F17200 Nicotine dependence, unspecified, uncomplicated: Secondary | ICD-10-CM | POA: Diagnosis not present

## 2023-05-13 DIAGNOSIS — R0609 Other forms of dyspnea: Secondary | ICD-10-CM | POA: Diagnosis not present

## 2023-05-13 DIAGNOSIS — E119 Type 2 diabetes mellitus without complications: Secondary | ICD-10-CM | POA: Diagnosis not present

## 2023-05-13 DIAGNOSIS — I10 Essential (primary) hypertension: Secondary | ICD-10-CM | POA: Diagnosis not present

## 2023-05-16 ENCOUNTER — Encounter (HOSPITAL_COMMUNITY): Payer: Self-pay | Admitting: Emergency Medicine

## 2023-05-16 ENCOUNTER — Emergency Department (HOSPITAL_COMMUNITY)
Admission: EM | Admit: 2023-05-16 | Discharge: 2023-05-16 | Disposition: A | Discharge status: Home / Self Care | Payer: 59 | Attending: Emergency Medicine | Admitting: Emergency Medicine

## 2023-05-16 ENCOUNTER — Other Ambulatory Visit: Payer: Self-pay

## 2023-05-16 ENCOUNTER — Emergency Department (HOSPITAL_COMMUNITY): Payer: 59

## 2023-05-16 DIAGNOSIS — L03116 Cellulitis of left lower limb: Secondary | ICD-10-CM | POA: Insufficient documentation

## 2023-05-16 DIAGNOSIS — M7989 Other specified soft tissue disorders: Secondary | ICD-10-CM | POA: Diagnosis present

## 2023-05-16 DIAGNOSIS — R600 Localized edema: Secondary | ICD-10-CM | POA: Insufficient documentation

## 2023-05-16 DIAGNOSIS — R0602 Shortness of breath: Secondary | ICD-10-CM | POA: Insufficient documentation

## 2023-05-16 DIAGNOSIS — R0600 Dyspnea, unspecified: Secondary | ICD-10-CM

## 2023-05-16 DIAGNOSIS — I700 Atherosclerosis of aorta: Secondary | ICD-10-CM | POA: Diagnosis not present

## 2023-05-16 DIAGNOSIS — E119 Type 2 diabetes mellitus without complications: Secondary | ICD-10-CM | POA: Diagnosis not present

## 2023-05-16 DIAGNOSIS — L03115 Cellulitis of right lower limb: Secondary | ICD-10-CM | POA: Insufficient documentation

## 2023-05-16 DIAGNOSIS — L03119 Cellulitis of unspecified part of limb: Secondary | ICD-10-CM

## 2023-05-16 DIAGNOSIS — Z79899 Other long term (current) drug therapy: Secondary | ICD-10-CM | POA: Diagnosis not present

## 2023-05-16 LAB — COMPREHENSIVE METABOLIC PANEL
ALT: 22 U/L (ref 0–44)
AST: 36 U/L (ref 15–41)
Albumin: 3.7 g/dL (ref 3.5–5.0)
Alkaline Phosphatase: 96 U/L (ref 38–126)
Anion gap: 8 (ref 5–15)
BUN: 15 mg/dL (ref 6–20)
CO2: 25 mmol/L (ref 22–32)
Calcium: 8.6 mg/dL — ABNORMAL LOW (ref 8.9–10.3)
Chloride: 103 mmol/L (ref 98–111)
Creatinine, Ser: 1.23 mg/dL (ref 0.61–1.24)
GFR, Estimated: >60 mL/min (ref >60)
Glucose, Bld: 112 mg/dL — ABNORMAL HIGH (ref 70–99)
Potassium: 3.9 mmol/L (ref 3.5–5.1)
Sodium: 136 mmol/L (ref 135–145)
Total Bilirubin: 0.5 mg/dL (ref 0.3–1.2)
Total Protein: 7.3 g/dL (ref 6.5–8.1)

## 2023-05-16 LAB — CBC
HCT: 47.1 % (ref 39.0–52.0)
Hemoglobin: 15.3 g/dL (ref 13.0–17.0)
MCH: 29 pg (ref 26.0–34.0)
MCHC: 32.5 g/dL (ref 30.0–36.0)
MCV: 89.4 fL (ref 80.0–100.0)
Platelets: 250 10*3/uL (ref 150–400)
RBC: 5.27 MIL/uL (ref 4.22–5.81)
RDW: 12.4 % (ref 11.5–15.5)
WBC: 8.4 10*3/uL (ref 4.0–10.5)
nRBC: 0 % (ref 0.0–0.2)

## 2023-05-16 LAB — TROPONIN I (HIGH SENSITIVITY): Troponin I (High Sensitivity): 5 ng/L (ref <18)

## 2023-05-16 LAB — BRAIN NATRIURETIC PEPTIDE: B Natriuretic Peptide: 14 pg/mL (ref 0.0–100.0)

## 2023-05-16 MED ORDER — ALBUTEROL SULFATE (2.5 MG/3ML) 0.083% IN NEBU
INHALATION_SOLUTION | RESPIRATORY_TRACT | Status: AC
  Administered 2023-05-16: 2.5 mg
  Filled 2023-05-16: qty 3

## 2023-05-16 MED ORDER — CEFADROXIL 500 MG PO CAPS: 500.0000 mg | ORAL_CAPSULE | Freq: Two times a day (BID) | ORAL | 0 refills | Status: DC

## 2023-05-16 MED ORDER — ALBUTEROL SULFATE HFA 108 (90 BASE) MCG/ACT IN AERS: 1.0000 | INHALATION_SPRAY | Freq: Four times a day (QID) | RESPIRATORY_TRACT | 0 refills | Status: AC | PRN

## 2023-05-16 MED ORDER — IPRATROPIUM-ALBUTEROL 0.5-2.5 (3) MG/3ML IN SOLN
3.0000 mL | Freq: Once | RESPIRATORY_TRACT | Status: AC
  Administered 2023-05-16: 3 mL via RESPIRATORY_TRACT
  Filled 2023-05-16: qty 3

## 2023-05-16 MED ORDER — KETOROLAC TROMETHAMINE 15 MG/ML IJ SOLN
15.0000 mg | Freq: Once | INTRAMUSCULAR | Status: AC
  Administered 2023-05-16: 15 mg via INTRAVENOUS
  Filled 2023-05-16: qty 1

## 2023-05-16 MED ORDER — IBUPROFEN 600 MG PO TABS: 600.0000 mg | ORAL_TABLET | Freq: Four times a day (QID) | ORAL | 0 refills | Status: AC | PRN

## 2023-05-16 NOTE — ED Triage Notes (Signed)
Pt via POV c/o burning to bilateral feet with mild swelling and SOB. Pt is scheduled for an echo tomorrow morning. Pt has also been feeling very anxious at times. Pt denies pain but states his feet and legs are very itchy.

## 2023-05-16 NOTE — Discharge Instructions (Addendum)
As discussed, regarding lower extremity swelling, recommend compression socks at home as well as elevation of your legs above the level of your heart to help decrease swelling.  Your legs do look infected so we will place you on antibiotic in the form of cefadroxil to take twice daily over the next 5 days.  Recommend taking your at home baclofen for burning type sensation in your legs as this is most likely secondary to neuropathy.  Will also send in medication called ibuprofen to take as needed for pain/inflammation.  Given your concern for blood clot in the leg, I have ordered a an ultrasound that can be done at the hospital tomorrow.  Keep your upcoming appointment with cardiology for echocardiogram for further assessment.  Please do not hesitate to return to emergency department for worrisome signs and symptoms we discussed become apparent.

## 2023-05-16 NOTE — ED Provider Notes (Signed)
[place] EMERGENCY DEPARTMENT AT [facility] Provider Note   CSN: [id number] Arrival date & time: 05/16/23  1841     History  Chief Complaint  Patient presents with   Leg Swelling    Johnny Hayes is a 52 y.o. male.  HPI   52 year old male presents emergency department with multiple different complaints.  Complaining of shortness of breath which has been present for the past several months.  States he has establish care with cardiology in the outpatient setting with planned echocardiogram tomorrow.  States that he has noticed bilateral lower extremity swelling that has been present for the past several weeks but has noticed it more over the past few days.  Reports cramping type sensation in his legs.  States that he has a history of similar symptoms due to statin medication and was recently begun back on statin therapy approximately 1 month ago and has had a cramping type sensation since then.  Also reports burning type sensation in the bottom of both of his feet that has been intermittent.  States that his primary care has stated that this is likely related to his diabetes but symptoms have persisted.  Mother visited patient today and heard more of complaints prompting visit to the emergency department because they have a planned flight tomorrow and wanted him to get evaluated.  Denies any fever, chills, night sweats, chest pain, abdominal pain, nausea, vomiting, urinary symptoms, change in bowel habits.  Past medical history significant for osteoarthritis, seizure, OSA  Home Medications Prior to Admission medications   Medication Sig Start Date End Date Taking? Authorizing Provider  albuterol (VENTOLIN HFA) 108 (90 Base) MCG/ACT inhaler Inhale 1-2 puffs into the lungs every 6 (six) hours as needed for wheezing or shortness of breath. 05/16/23  Yes Sherian Maroon A, PA  cefadroxil (DURICEF) 500 MG capsule Take 1 capsule (500 mg total) by mouth 2 (two) times daily. 05/16/23   Yes Sherian Maroon A, PA  ibuprofen (ADVIL) 600 MG tablet Take 1 tablet (600 mg total) by mouth every 6 (six) hours as needed. 05/16/23  Yes Sherian Maroon A, PA  amLODipine (NORVASC) 5 MG tablet Take 5 mg by mouth daily. 09/01/18   [provider]  baclofen (LIORESAL) 10 MG tablet Take 10 mg by mouth 3 (three) times daily as needed. 11/28/19   [provider]  naproxen sodium (ALEVE) 220 MG tablet Take 220-440 mg by mouth 2 (two) times daily as needed (pain).    [provider]      Allergies    Penicillins    Review of Systems   Review of Systems  All other systems reviewed and are negative.   Physical Exam Updated Vital Signs BP (!) 138/101   Pulse 93   Temp 98.8 F (37.1 C) (Oral)   Resp 19   Ht 6' 6.5" (1.994 m)   Wt (!) 175.1 kg   SpO2 95%   BMI 44.04 kg/m  Physical Exam Vitals and nursing note reviewed.  Constitutional:      General: He is not in acute distress.    Appearance: He is well-developed.  HENT:     Head: Normocephalic and atraumatic.  Eyes:     Conjunctiva/sclera: Conjunctivae normal.  Cardiovascular:     Rate and Rhythm: Normal rate and regular rhythm.     Heart sounds: No murmur heard. Pulmonary:     Effort: Pulmonary effort is normal. No respiratory distress.     Comments: Mild wheeze auscultated Abdominal:  Palpations: Abdomen is soft.     Tenderness: There is no abdominal tenderness.  Musculoskeletal:        General: No swelling.     Cervical back: Neck supple.     Right lower leg: Edema present.     Left lower leg: Edema present.     Comments: Patient with 2+ pitting edema bilateral lower extremities with chronic venous stasis changes along anterior aspect of lower extremities.  Open wounds present; very superficial.  Bilateral lower extremities erythematous in appearance with overlying tenderness to palpation. Pedal pulses 2+ bilaterally.  Patient full range of motion bilateral hips, knees, ankles, digits.   Skin:    General: Skin is warm and dry.     Capillary Refill: Capillary refill takes less than 2 seconds.  Neurological:     Mental Status: He is alert.  Psychiatric:        Mood and Affect: Mood normal.     ED Results / Procedures / Treatments   Labs (all labs ordered are listed, but only abnormal results are displayed) Labs Reviewed  COMPREHENSIVE METABOLIC PANEL - Abnormal; Notable for the following components:      Result Value   Glucose, Bld 112 (*)    Calcium 8.6 (*)    All other components within normal limits  CBC  BRAIN NATRIURETIC PEPTIDE  TROPONIN I (HIGH SENSITIVITY)  TROPONIN I (HIGH SENSITIVITY)    EKG EKG Interpretation Date/Time:  Sunday May 16 2023 20:15:20 EDT Ventricular Rate:  80 PR Interval:  171 QRS Duration:  156 QT Interval:  401 QTC Calculation: 463 R Axis:   37  Text Interpretation: Sinus rhythm Right bundle branch block No significant change since prior 7/19 Confirmed by Meridee Score [phone]) on 05/16/2023 8:20:43 PM  Radiology DG Chest 2 View  Result Date: 05/16/2023 CLINICAL DATA:  Shortness of breath EXAM: CHEST - 2 VIEW COMPARISON:  Radiograph 12/29/2019 FINDINGS: Stable cardiomediastinal silhouette. Aortic atherosclerotic calcification. No focal consolidation, pleural effusion, or pneumothorax. No displaced rib fractures. IMPRESSION: No active cardiopulmonary disease. Electronically Signed   By: Minerva Fester M.D.   On: 05/16/2023 19:37    Procedures Procedures    Medications Ordered in ED Medications  ketorolac (TORADOL) 15 MG/ML injection 15 mg (has no administration in time range)  ipratropium-albuterol (DUONEB) 0.5-2.5 (3) MG/3ML nebulizer solution 3 mL (3 mLs Nebulization Given 05/16/23 2013)  albuterol (PROVENTIL) (2.5 MG/3ML) 0.083% nebulizer solution (2.5 mg  Given 05/16/23 2017)    ED Course/ Medical Decision Making/ A&P Clinical Course as of 05/16/23 2124  Sun May 16, 2023  2105 DG Chest 2 View [CR]    Clinical  Course User Index [CR] Peter Garter, [place]                             Medical Decision Making Amount and/or Complexity of Data Reviewed Labs: ordered. Radiology: ordered.   This patient presents to the ED for concern of shortness of breath, this involves an extensive number of treatment options, and is a complaint that carries with it a high risk of complications and morbidity.  The differential diagnosis includes The causes for shortness of breath include but are not limited to Cardiac (AHF, pericardial effusion and tamponade, arrhythmias, ischemia, etc) Respiratory (COPD, asthma, pneumonia, pneumothorax, primary pulmonary hypertension, PE/VQ mismatch) Hematological (anemia)  Co morbidities that complicate the patient evaluation  See HPI   Additional history obtained:  Additional history obtained from EMR External  records from outside source obtained and reviewed including hospital records   Lab Tests:  I Ordered, and personally interpreted labs.  The pertinent results include: Mild hypocalcemia of 8.6 but otherwise, electrolytes within normal limits.  No transaminitis.  No renal dysfunction.  No leukocytosis.  No evidence of anemia.  Placed within range.  Initial troponin of 5.  BNP of 14   Imaging Studies ordered:  I ordered imaging studies including chest x-ray I independently visualized and interpreted imaging which showed no acute cardiopulmonary abnormalities I agree with the radiologist interpretation   Cardiac Monitoring: / EKG:  The patient was maintained on a cardiac monitor.  I personally viewed and interpreted the cardiac monitored which showed an underlying rhythm of: Sinus rhythm with evidence of right bundle branch block without acute ischemic changes from prior EKG performed   Consultations Obtained:  N/a   Problem List / ED Course / Critical interventions / Medication management  Shortness of breath, lower extremity swelling I ordered  medication including DuoNeb, Toradol  Reevaluation of the patient after these medicines showed that the patient improved I have reviewed the patients home medicines and have made adjustments as needed   Social Determinants of Health:  Denies tobacco, illicit drug use   Test / Admission - Considered:  Shortness of breath, lower extremity swelling Vitals signs significant for mild hypertension with blood pressure 138/101. Otherwise within normal range and stable throughout visit. Laboratory/imaging studies significant for: See above 52 year old male presents emergency department with complaints of shortness of breath, lower extremity swelling/pain.  Patient's shortness of breath seems to have been present for the past several months without significant acute worsening over the past few days.  Low suspicion for ACS given lack of ischemic changes on EKG and initially negative troponin.  Patient with heart score of 0 -3 so doubt ACS.  Patient without evidence of anemia.  Chest x-ray was obtained which was without evidence of pneumonia, pulmonary vascular congestions, pneumothorax or other abnormality as attributable to the patient's shortness of breath.  BNP normal so doubt CHF.  Without evidence of tachycardia, tachypnea, risk factors for DVT/PE with Pesi score of 61 so doubt PE.  Patient did note some improvement with albuterol breathing treatment while in the ED as well as sent home with albuterol inhaler for concern for possible bronchitis, underlying asthma.  Regarding patient's lower extremity swelling, low suspicion for CHF given negative BNP and lack of pulmonary vascular congestion on chest x-ray.  Patient's legs are erythematous appearing with overlying tenderness to palpation and warm to the touch consistent with cellulitis.  Patient also describing burning type pain of which has been present for the past several months more consistent with diabetic neuropathy of which she has been prescribed  with baclofen in the past.  Patient states he has not been using baclofen for his nerve pain despite being prescribed it.  No evidence of pulse deficits to suspect ischemic limb.  Compartment soft and supple so doubt compartment syndrome.  No evidence of traumatic injury so low suspicion for acute fracture/dislocation.  Will place patient on antibiotics and recommend compression stockings, elevation of lower legs above heart as well as continued use of baclofen at home and NSAIDs for pain.  Patient recommended follow-up with primary care within 1 to 2 days for reassessment of symptoms.  Treatment plan discussed at length with patient and he acknowledged understanding was agreeable to said plan. Worrisome signs and symptoms were discussed with the patient, and the patient acknowledged understanding  to return to the ED if noticed. Patient was stable upon discharge.          Final Clinical Impression(s) / ED Diagnoses Final diagnoses:  Cellulitis of lower extremity, unspecified laterality  Peripheral edema  Dyspnea, unspecified type    Rx / DC Orders ED Discharge Orders          Ordered         cefadroxil (DURICEF) 500 MG capsule  2 times daily        05/16/23 2118    ibuprofen (ADVIL) 600 MG tablet  Every 6 hours PRN        05/16/23 2118    albuterol (VENTOLIN HFA) 108 (90 Base) MCG/ACT inhaler  Every 6 hours PRN        05/16/23 2118              Peter Garter, [place] 05/16/23 2124    Terrilee Files, MD 05/17/23 [id number]

## 2023-05-17 ENCOUNTER — Ambulatory Visit (HOSPITAL_COMMUNITY)
Admission: RE | Admit: 2023-05-17 | Discharge: 2023-05-17 | Disposition: A | Discharge status: Home / Self Care | Payer: 59 | Source: Ambulatory Visit | Attending: Emergency Medicine | Admitting: Emergency Medicine

## 2023-05-17 DIAGNOSIS — R600 Localized edema: Secondary | ICD-10-CM | POA: Insufficient documentation

## 2023-05-17 DIAGNOSIS — R0609 Other forms of dyspnea: Secondary | ICD-10-CM | POA: Diagnosis not present

## 2023-05-17 DIAGNOSIS — R0602 Shortness of breath: Secondary | ICD-10-CM | POA: Insufficient documentation

## 2023-05-17 NOTE — ED Provider Notes (Signed)
Patient seen last night for left leg pain and swelling, had DVT study ordered for this morning which was negative.  He is having burning to his feet which he has been told is likely due to his diabetic neuropathy.  No new symptoms.  He is well-appearing.  Informed patient of his normal results and PCP follow-up was indicated.  They are agreeable with plan of care.   Ma Rings, PA-C 05/17/23 1519    Franne Forts, DO 05/24/23 2330

## 2023-05-20 ENCOUNTER — Telehealth: Payer: Self-pay

## 2023-05-20 NOTE — Telephone Encounter (Signed)
Transition Care Management Unsuccessful Follow-up Telephone Call  Date of discharge and from where:  [facility] 7/14  Attempts:  1st Attempt  Reason for unsuccessful TCM follow-up call:  No answer/busy   Johnny Hayes [facility] Guide, [facility] [phone] [address] [place], [place], [place] [address] Phone: [phone] Email: [email or web address]

## 2023-05-21 ENCOUNTER — Telehealth: Payer: Self-pay

## 2023-05-21 NOTE — Telephone Encounter (Signed)
Transition Care Management Unsuccessful Follow-up Telephone Call  Date of discharge and from where:  [facility] 7/14  Attempts:  2nd Attempt  Reason for unsuccessful TCM follow-up call:  No answer/busy   Johnny Hayes [facility] Guide, [facility] [phone] [address], [place], [place] [address] Phone: [phone] Email: [email or web address]

## 2023-05-26 DIAGNOSIS — E1140 Type 2 diabetes mellitus with diabetic neuropathy, unspecified: Secondary | ICD-10-CM | POA: Diagnosis not present

## 2023-05-26 DIAGNOSIS — Z8249 Family history of ischemic heart disease and other diseases of the circulatory system: Secondary | ICD-10-CM | POA: Diagnosis not present

## 2023-05-26 DIAGNOSIS — I700 Atherosclerosis of aorta: Secondary | ICD-10-CM | POA: Diagnosis not present

## 2023-05-26 DIAGNOSIS — E538 Deficiency of other specified B group vitamins: Secondary | ICD-10-CM | POA: Diagnosis not present

## 2023-05-26 DIAGNOSIS — R0602 Shortness of breath: Secondary | ICD-10-CM | POA: Diagnosis not present

## 2023-05-26 DIAGNOSIS — I10 Essential (primary) hypertension: Secondary | ICD-10-CM | POA: Diagnosis not present

## 2023-05-31 DIAGNOSIS — I700 Atherosclerosis of aorta: Secondary | ICD-10-CM | POA: Diagnosis not present

## 2023-05-31 DIAGNOSIS — I7121 Aneurysm of the ascending aorta, without rupture: Secondary | ICD-10-CM | POA: Diagnosis not present

## 2023-05-31 DIAGNOSIS — I2510 Atherosclerotic heart disease of native coronary artery without angina pectoris: Secondary | ICD-10-CM | POA: Diagnosis not present

## 2023-05-31 DIAGNOSIS — Z8249 Family history of ischemic heart disease and other diseases of the circulatory system: Secondary | ICD-10-CM | POA: Diagnosis not present

## 2023-06-01 DIAGNOSIS — I700 Atherosclerosis of aorta: Secondary | ICD-10-CM | POA: Insufficient documentation

## 2023-06-03 DIAGNOSIS — G4733 Obstructive sleep apnea (adult) (pediatric): Secondary | ICD-10-CM | POA: Diagnosis not present

## 2023-06-23 NOTE — Progress Notes (Signed)
Johnny Hayes, male    DOB: 21-Dec-1970    MRN: [id number]   Brief patient profile:  51 yobm active smoker/Morbidly obese   referred to pulmonary clinic in [place]  06/24/2023 by Johnny Hayes p neg cardiac  w/u for doe x ?  2022    Wt was 359 on 04/20/2019   History of Present Illness  06/24/2023  Pulmonary/ 1st office eval/ Johnny Hayes / [place] [facility] @ 380 lbs  Chief Complaint  Patient presents with   Establish Care   Shortness of Breath  Dyspnea:  mb and back 27ft slt  uphill to house stops half way due dizziness  Walks at grocery store slower pace than others = MMRC2 = can't walk a nl pace on a flat grade s sob but does fine slow  Cough: not much  Sleep: on bipap and flat bed x 2 pillows s resp cc / wakes up feeling refreshed  SABA use: not sure if helps  02: none  Lung cancer screen: needed 05/2024   No obvious day to day or daytime pattern/variability or assoc excess/ purulent sputum or mucus plugs or hemoptysis or cp or chest tightness, subjective wheeze or overt sinus or hb symptoms.    Also denies any obvious fluctuation of symptoms with weather or environmental changes or other aggravating or alleviating factors except as outlined above   No unusual exposure hx or h/o childhood pna/ asthma or knowledge of premature birth.  Current Allergies, Complete Past Medical History, Past Surgical History, Family History, and Social History were reviewed in [employer] record.  ROS  The following are not active complaints unless bolded Hoarseness, sore throat, dysphagia, dental problems, itching, sneezing,  nasal congestion or discharge of excess mucus or purulent secretions, ear ache,   fever, chills, sweats, unintended wt loss or wt gain, classically pleuritic or exertional cp,  orthopnea pnd or arm/hand swelling  or leg swelling, presyncope, palpitations, abdominal pain, anorexia, nausea, vomiting, diarrhea  or change in bowel habits or change in bladder habits,  change in stools or change in urine, dysuria, hematuria,  rash, arthralgias, visual complaints, headache, numbness, weakness or ataxia or problems with walking or coordination,  change in mood or  memory.            Outpatient Medications Prior to Visit  Medication Sig Dispense Refill   albuterol (VENTOLIN HFA) 108 (90 Base) MCG/ACT inhaler Inhale 1-2 puffs into the lungs every 6 (six) hours as needed for wheezing or shortness of breath. 6.7 g 0   amLODipine (NORVASC) 5 MG tablet Take 5 mg by mouth daily.  0   atorvastatin (LIPITOR) 20 MG tablet Take 20 mg by mouth daily.     baclofen (LIORESAL) 10 MG tablet Take 10 mg by mouth 3 (three) times daily as needed.     cefadroxil (DURICEF) 500 MG capsule Take 1 capsule (500 mg total) by mouth 2 (two) times daily. 10 capsule 0   [employer] VITAMIN B12 1000 MCG tablet Take 1,000 mcg by mouth daily.     gabapentin (NEURONTIN) 300 MG capsule Take 300 mg by mouth 3 (three) times daily.     ibuprofen (ADVIL) 600 MG tablet Take 1 tablet (600 mg total) by mouth every 6 (six) hours as needed. 30 tablet 0   metFORMIN (GLUCOPHAGE-XR) 500 MG 24 hr tablet Take 500 mg by mouth daily with breakfast.     naproxen sodium (ALEVE) 220 MG tablet Take 220-440 mg by mouth 2 (two) times  daily as needed (pain).     Omega-3 1000 MG CAPS Take by mouth.     No facility-administered medications prior to visit.    Past Medical History:  Diagnosis Date   Development delay    "no longer takes depakote"   Hypertension    Osteoarthritis of knee    left   Seizures (HCC)    Sz. as a child. Last sz. at age 34, per mother   Sleep apnea       Objective:     BP 134/80   Pulse 91   Ht 6' 6.5" (1.994 m)   Wt (!) 380 lb (172.4 kg)   SpO2 94%   BMI 43.36 kg/m   SpO2: 94 % RA    HEENT : Oropharynx  clear/ airway M3/4     Nasal turbinates nl    NECK :  without  apparent JVD/ palpable Nodes/TM    LUNGS: no acc muscle use,  Nl contour chest which is clear to A and P  bilaterally without cough on insp or exp maneuvers   CV:  RRR  no s3 or murmur or increase in P2, and no edema   ABD:  soft and nontender with nl inspiratory excursion in the supine position. No bruits or organomegaly appreciated   MS:  Nl gait/ ext warm without deformities Or obvious joint restrictions  calf tenderness, cyanosis or clubbing    SKIN: warm and dry without lesions    NEURO:  alert, approp, nl sensorium with  no motor or cerebellar deficits apparent.   Labs ordered/ reviewed:      Chemistry      Component Value Date/Time   NA 136 05/16/2023 1947   K 3.9 05/16/2023 1947   CL 103 05/16/2023 1947   CO2 25 05/16/2023 1947   BUN 15 05/16/2023 1947   CREATININE 1.23 05/16/2023 1947      Component Value Date/Time   CALCIUM 8.6 (L) 05/16/2023 1947   ALKPHOS 96 05/16/2023 1947   AST 36 05/16/2023 1947   ALT 22 05/16/2023 1947   BILITOT 0.5 05/16/2023 1947        Lab Results  Component Value Date   WBC 8.4 05/16/2023   HGB 15.3 05/16/2023   HCT 47.1 05/16/2023   MCV 89.4 05/16/2023   PLT 250 05/16/2023             I personally reviewed images and agree with radiology impression as follows:   Chest CT 05/31/23 cardiac The lungs are clear bilaterally.  Assessment    DOE (dyspnea on exertion)    Assessment & Plan: Active smoker started ? Age 87 @ 1 ppd  Onset doe 2022  - 06/24/2023  @ 280 lbs  Walked on RA  x  3  lap(s) =  approx 450  ft  @ moderate pace, stopped due to end of study with lowest 02 sats 98% and min sob immediately resolved sitting   - PFTs 06/24/2023  ordered.   No evidence of a lung problem here but have not been able to rule out asthma  or incipient copd completely as pt not sure whether or not inhalers really help so:  - The proper method of use, as well as anticipated side effects, of a metered-dose inhaler were discussed and demonstrated to the patient using teach back method and an empty hfa device.  Re SABA :  I spent extra time  with pt today reviewing appropriate use of albuterol for prn use  on exertion with the following points: 1) saba is for relief of sob that does not improve by walking a slower pace or resting but rather if the pt does not improve after trying this first. 2) If the pt is convinced, as many are, that saba helps recover from activity faster then it's easy to tell if this is the case by re-challenging : ie stop, take the inhaler, then p 5 minutes try the exact same activity (intensity of workload) that just caused the symptoms and see if they are substantially diminished or not after saba 3) if there is an activity that reproducibly causes the symptoms, try the saba 15 min before the activity on alternate days   If in fact the saba really does help, then fine to continue to use it prn but advised may need to look closer at the maintenance regimen (for now =0) being used to achieve better control of airways disease with exertion.   >>> sub max ex program/ f/u here in 3 m with pfts     Orders: -     Pulmonary Function Test; Future  Cigarette smoker Assessment & Plan: Counseled re importance of smoking cessation but did not meet time criteria for separate billing    Low-dose CT lung cancer screening is recommended for patients who are 67-69 years of age with a 20+ pack-year history of smoking and who are currently smoking or quit <=15 years ago. No coughing up blood  No unintentional weight loss of > 15 pounds in the last 6 months - pt is eligible for scanning yearly until 5 y p he quits  Just had ct chest for coronary scan so will be due 05/2024 / informed                   Morbid (severe) obesity due to excess calories (HCC) Overview: per last BMI of 40.43 on 04/20/2019  Assessment & Plan: Body mass index is 43.36 kg/m.    No results found for: "TSH"    Contributing to doe and risk of GERD/dvt/pe >>>   reviewed the need and the process to achieve and maintain neg calorie balance  > defer f/u primary care including intermittently monitoring thyroid status     Each maintenance medication was reviewed in detail including emphasizing most importantly the difference between maintenance and prns and under what circumstances the prns are to be triggered using an action plan format where appropriate.  Total time for H and P, chart review, counseling, reviewing hfa device(s) , directly observing portions of ambulatory 02 saturation study/ and generating customized AVS unique to this office visit / same day charting = 45 min new pt eval          Sandrea Hughs, MD 06/24/2023

## 2023-06-24 ENCOUNTER — Encounter: Payer: Self-pay | Admitting: Internal Medicine

## 2023-06-24 ENCOUNTER — Ambulatory Visit: Payer: 59 | Admitting: Internal Medicine

## 2023-06-24 VITALS — BP 134/80 | HR 91 | Ht 78.5 in | Wt 380.0 lb

## 2023-06-24 DIAGNOSIS — R4189 Other symptoms and signs involving cognitive functions and awareness: Secondary | ICD-10-CM | POA: Insufficient documentation

## 2023-06-24 DIAGNOSIS — F17210 Nicotine dependence, cigarettes, uncomplicated: Secondary | ICD-10-CM

## 2023-06-24 DIAGNOSIS — R0609 Other forms of dyspnea: Secondary | ICD-10-CM

## 2023-06-24 DIAGNOSIS — T300 Burn of unspecified body region, unspecified degree: Secondary | ICD-10-CM | POA: Insufficient documentation

## 2023-06-24 NOTE — Patient Instructions (Addendum)
To get the most out of exercise, you need to be continuously aware that you are short of breath, but never out of breath,  building up to at least 30 minutes daily. As you improve, it will actually be easier for you to do the same amount of exercise  in  30 minutes so always push to the level where you are short of breath.   Only use your albuterol as a rescue medication to be used if you can't catch your breath by resting or doing a relaxed purse lip breathing pattern.  - The less you use it, the better it will work when you need it. - Ok to use up to 2 puffs  every 4 hours if you must but call for immediate appointment if use goes up over your usual need - Don't leave home without it !!  (think of it like the spare tire for your car)   Also  Ok to try albuterol x  puffs x 15 min before an activity (on alternating days)  that you know would usually make you short of breath and see if it makes any difference and if makes none then don't take albuterol after activity unless you can't catch your breath as this means it's the resting that helps, not the albuterol.  The key is to stop smoking completely before smoking completely stops you!   Please schedule a follow up visit in 3 months but call sooner if needed with PFTs on return

## 2023-06-24 NOTE — Assessment & Plan Note (Addendum)
Active smoker started ? Age 52 @ 1 ppd  Onset doe 2022  - 06/24/2023  @ 280 lbs  Walked on RA  x  3  lap(s) =  approx 450  ft  @ moderate pace, stopped due to end of study with lowest 02 sats 98% and min sob immediately resolved sitting   - PFTs 06/24/2023  ordered.   No evidence of a lung problem here but have not been able to rule out asthma  or incipient copd completely as pt not sure whether or not inhalers really help so:  - The proper method of use, as well as anticipated side effects, of a metered-dose inhaler were discussed and demonstrated to the patient using teach back method and an empty hfa device.  Re SABA :  I spent extra time with pt today reviewing appropriate use of albuterol for prn use on exertion with the following points: 1) saba is for relief of sob that does not improve by walking a slower pace or resting but rather if the pt does not improve after trying this first. 2) If the pt is convinced, as many are, that saba helps recover from activity faster then it's easy to tell if this is the case by re-challenging : ie stop, take the inhaler, then p 5 minutes try the exact same activity (intensity of workload) that just caused the symptoms and see if they are substantially diminished or not after saba 3) if there is an activity that reproducibly causes the symptoms, try the saba 15 min before the activity on alternate days   If in fact the saba really does help, then fine to continue to use it prn but advised may need to look closer at the maintenance regimen (for now =0) being used to achieve better control of airways disease with exertion.   >>> sub max ex program/ f/u here in 3 m with pfts

## 2023-06-25 DIAGNOSIS — F17210 Nicotine dependence, cigarettes, uncomplicated: Secondary | ICD-10-CM | POA: Insufficient documentation

## 2023-06-25 NOTE — Assessment & Plan Note (Addendum)
Body mass index is 43.36 kg/m.    No results found for: "TSH"    Contributing to doe and risk of GERD/dvt/pe >>>   reviewed the need and the process to achieve and maintain neg calorie balance > defer f/u primary care including intermittently monitoring thyroid status     Each maintenance medication was reviewed in detail including emphasizing most importantly the difference between maintenance and prns and under what circumstances the prns are to be triggered using an action plan format where appropriate.  Total time for H and P, chart review, counseling, reviewing hfa device(s) , directly observing portions of ambulatory 02 saturation study/ and generating customized AVS unique to this office visit / same day charting = 45 min new pt eval

## 2023-06-25 NOTE — Assessment & Plan Note (Addendum)
Counseled re importance of smoking cessation but did not meet time criteria for separate billing    Low-dose CT lung cancer screening is recommended for patients who are 62-52 years of age with a 20+ pack-year history of smoking and who are currently smoking or quit <=15 years ago. No coughing up blood  No unintentional weight loss of > 15 pounds in the last 6 months - pt is eligible for scanning yearly until 39 y p he quits  Just had ct chest for coronary scan so will be due 05/2024 / informed

## 2023-06-30 DIAGNOSIS — G4733 Obstructive sleep apnea (adult) (pediatric): Secondary | ICD-10-CM | POA: Diagnosis not present

## 2023-06-30 DIAGNOSIS — E119 Type 2 diabetes mellitus without complications: Secondary | ICD-10-CM | POA: Diagnosis not present

## 2023-06-30 DIAGNOSIS — E782 Mixed hyperlipidemia: Secondary | ICD-10-CM | POA: Diagnosis not present

## 2023-06-30 DIAGNOSIS — I10 Essential (primary) hypertension: Secondary | ICD-10-CM | POA: Diagnosis not present

## 2023-06-30 DIAGNOSIS — I7121 Aneurysm of the ascending aorta, without rupture: Secondary | ICD-10-CM | POA: Insufficient documentation

## 2023-06-30 DIAGNOSIS — E785 Hyperlipidemia, unspecified: Secondary | ICD-10-CM | POA: Diagnosis not present

## 2023-06-30 DIAGNOSIS — I2510 Atherosclerotic heart disease of native coronary artery without angina pectoris: Secondary | ICD-10-CM | POA: Insufficient documentation

## 2023-06-30 DIAGNOSIS — F17200 Nicotine dependence, unspecified, uncomplicated: Secondary | ICD-10-CM | POA: Diagnosis not present

## 2023-06-30 DIAGNOSIS — R0609 Other forms of dyspnea: Secondary | ICD-10-CM | POA: Diagnosis not present

## 2023-06-30 DIAGNOSIS — I2584 Coronary atherosclerosis due to calcified coronary lesion: Secondary | ICD-10-CM | POA: Diagnosis not present

## 2023-07-06 DIAGNOSIS — G4733 Obstructive sleep apnea (adult) (pediatric): Secondary | ICD-10-CM | POA: Diagnosis not present

## 2023-07-21 ENCOUNTER — Encounter: Payer: Self-pay | Admitting: Internal Medicine

## 2023-07-22 DIAGNOSIS — I890 Lymphedema, not elsewhere classified: Secondary | ICD-10-CM | POA: Diagnosis not present

## 2023-07-22 DIAGNOSIS — I872 Venous insufficiency (chronic) (peripheral): Secondary | ICD-10-CM | POA: Diagnosis not present

## 2023-08-02 DIAGNOSIS — G4733 Obstructive sleep apnea (adult) (pediatric): Secondary | ICD-10-CM | POA: Diagnosis not present

## 2023-08-03 DIAGNOSIS — I890 Lymphedema, not elsewhere classified: Secondary | ICD-10-CM | POA: Diagnosis not present

## 2023-08-09 DIAGNOSIS — I890 Lymphedema, not elsewhere classified: Secondary | ICD-10-CM | POA: Diagnosis not present

## 2023-08-11 ENCOUNTER — Encounter: Payer: Self-pay | Admitting: Cardiology

## 2023-08-11 ENCOUNTER — Encounter: Payer: Self-pay | Admitting: *Deleted

## 2023-08-11 ENCOUNTER — Ambulatory Visit: Payer: 59 | Attending: Cardiology | Admitting: Cardiology

## 2023-08-11 VITALS — BP 122/65 | HR 94 | Ht 78.5 in | Wt 376.0 lb

## 2023-08-11 DIAGNOSIS — I10 Essential (primary) hypertension: Secondary | ICD-10-CM | POA: Diagnosis not present

## 2023-08-11 DIAGNOSIS — E782 Mixed hyperlipidemia: Secondary | ICD-10-CM

## 2023-08-11 DIAGNOSIS — R0609 Other forms of dyspnea: Secondary | ICD-10-CM

## 2023-08-11 DIAGNOSIS — I2510 Atherosclerotic heart disease of native coronary artery without angina pectoris: Secondary | ICD-10-CM | POA: Diagnosis not present

## 2023-08-11 NOTE — Progress Notes (Signed)
Clinical Summary Johnny Hayes is a 52 y.o.male seen today as a new patient.   Previously followed at [facility]    1.CAD - 05/2023 coronary calcium [facility]: 3 vessel CAD, calcium score 355 98th percentile.  - previously started on ASA, atorvastastin - no specific chest pains. Has had significant DOE that has been undergoing evaluation by [facility] cardiology, transferred to [facility] cards.     2.DOE - 05/2023 echo [facility]: LVEF 65%, grade I dd - ongoing for about 1 year. Typically with activities. DOE with short distances - denies any chest pains.  - has had some LE edema - some cough, no wheezing. +smoker x 20 years.  - seen by pulmonary, upcoming PFTs.     3. HTN - compliant with meds   4. HLD - 06/2023 TC 145 TG 68 HDL 52 LDL 79 - he is on atorvastatin 20mg  daily, recently started   5. COPD - from notes had seen pulmonary, PFTs were pending - PFTs pending for Friday   6. DM2 09/2022 HgbA1c 6.4  7. OSA on bipap - to establish with [place] pulmonary   8. Aortic aneurysm 05/2023 coronary CT showed 4.1 cm ascending aortic aneurysm - needs CTA 05/2024  9. Lymphedema - just started seeing lymphedema clinic  Past Medical History:  Diagnosis Date   Development delay    "no longer takes depakote"   Hypertension    Osteoarthritis of knee    left   Seizures (HCC)    Sz. as a child. Last sz. at age 45, per mother   Sleep apnea      Allergies  Allergen Reactions   Penicillins Hives    Has patient had a PCN reaction causing immediate rash, facial/tongue/throat swelling, SOB or lightheadedness with hypotension: Yes Has patient had a PCN reaction causing severe rash involving mucus membranes or skin necrosis: No Has patient had a PCN reaction that required hospitalization: Yes Has patient had a PCN reaction occurring within the last 10 years: No If all of the above answers are "NO", then may proceed with Cephalosporin use.      Current Outpatient Medications   Medication Sig Dispense Refill   albuterol (VENTOLIN HFA) 108 (90 Base) MCG/ACT inhaler Inhale 1-2 puffs into the lungs every 6 (six) hours as needed for wheezing or shortness of breath. 6.7 g 0   amLODipine (NORVASC) 5 MG tablet Take 5 mg by mouth daily.  0   aspirin EC 81 MG tablet Take 1 tablet by mouth daily.     atorvastatin (LIPITOR) 20 MG tablet Take 20 mg by mouth daily.     baclofen (LIORESAL) 10 MG tablet Take 10 mg by mouth 3 (three) times daily as needed.     CVS VITAMIN B12 1000 MCG tablet Take 1,000 mcg by mouth daily.     gabapentin (NEURONTIN) 300 MG capsule Take 300 mg by mouth 3 (three) times daily.     ibuprofen (ADVIL) 600 MG tablet Take 1 tablet (600 mg total) by mouth every 6 (six) hours as needed. 30 tablet 0   isosorbide mononitrate (IMDUR) 30 MG 24 hr tablet Take 1 tablet by mouth daily.     metFORMIN (GLUCOPHAGE-XR) 500 MG 24 hr tablet Take 500 mg by mouth daily with breakfast.     naproxen sodium (ALEVE) 220 MG tablet Take 220-440 mg by mouth 2 (two) times daily as needed (pain).     Omega-3 1000 MG CAPS Take by mouth.  triamcinolone cream (KENALOG) 0.1 % Apply 1 Application topically 2 (two) times daily.     cefadroxil (DURICEF) 500 MG capsule Take 1 capsule (500 mg total) by mouth 2 (two) times daily. (Patient not taking: Reported on 08/11/2023) 10 capsule 0   No current facility-administered medications for this visit.     Past Surgical History:  Procedure Laterality Date   DIRECT LARYNGOSCOPY N/A 05/27/2018   Procedure: DIRECT LARYNGOSCOPY and ESOPHAGOSCOPY;  Surgeon: Flo Shanks, MD;  Location: [facility] OR;  Service: ENT;  Laterality: N/A;   KNEE ARTHROPLASTY Left 03/20/2015   Procedure: COMPUTER ASSISTED TOTAL KNEE ARTHROPLASTY;  Surgeon: Eldred Manges, MD;  Location: [facility] OR;  Service: Orthopedics;  Laterality: Left;   KNEE ARTHROSCOPY W/ DEBRIDEMENT Left 2001     Allergies  Allergen Reactions   Penicillins Hives    Has patient had a PCN reaction causing  immediate rash, facial/tongue/throat swelling, SOB or lightheadedness with hypotension: Yes Has patient had a PCN reaction causing severe rash involving mucus membranes or skin necrosis: No Has patient had a PCN reaction that required hospitalization: Yes Has patient had a PCN reaction occurring within the last 10 years: No If all of the above answers are "NO", then may proceed with Cephalosporin use.       Family History  Problem Relation Age of Onset   Fibromyalgia Mother    Peripheral vascular disease Father    COPD Father    Migraines Sister    Hypertension Sister      Social History Johnny Hayes reports that he has been smoking cigarettes. He has never used smokeless tobacco. Johnny Hayes reports no history of alcohol use.   Review of Systems CONSTITUTIONAL: No weight loss, fever, chills, weakness or fatigue.  HEENT: Eyes: No visual loss, blurred vision, double vision or yellow sclerae.No hearing loss, sneezing, congestion, runny nose or sore throat.  SKIN: No rash or itching.  CARDIOVASCULAR: per hpi RESPIRATORY: No shortness of breath, cough or sputum.  GASTROINTESTINAL: No anorexia, nausea, vomiting or diarrhea. No abdominal pain or blood.  GENITOURINARY: No burning on urination, no polyuria NEUROLOGICAL: No headache, dizziness, syncope, paralysis, ataxia, numbness or tingling in the extremities. No change in bowel or bladder control.  MUSCULOSKELETAL: No muscle, back pain, joint pain or stiffness.  LYMPHATICS: No enlarged nodes. No history of splenectomy.  PSYCHIATRIC: No history of depression or anxiety.  ENDOCRINOLOGIC: No reports of sweating, cold or heat intolerance. No polyuria or polydipsia.  Marland Kitchen   Physical Examination Today's Vitals   08/11/23 1038 08/11/23 1042 08/11/23 1112  BP: (!) 150/102 (!) 160/90 122/65  Pulse: 94    SpO2: 97%    Weight: (!) 376 lb (170.6 kg)    Height: 6' 6.5" (1.994 m)     Body mass index is 42.9 kg/m.  Gen: resting comfortably,  no acute distress HEENT: no scleral icterus, pupils equal round and reactive, no palptable cervical adenopathy,  CV: RRR, no mrg, no jvd Resp: Clear to auscultation bilaterally GI: abdomen is soft, non-tender, non-distended, normal bowel sounds, no hepatosplenomegaly MSK: extremities are warm, no edema.  Skin: warm, no rash Neuro:  no focal deficits Psych: appropriate affect     Assessment and Plan  1.CAD - multivessel coronary artery calcification with 98th percentile coronary calcium score - significnat DOE unclear etiology, will require a functional assessment of his coronary disease - given weight, multiple coronary calcifications will plan for stress PET scan. I did curbside Dr Scharlene Gloss and agrees would be better imaging option  than coronary CTA - continue ASA, statin  2. HLD - continue statin, goal LDL would be <70 for now. Just restarted statin, follow labs  3. HTN - at goal, significantly elevated upon arrival but repeat manual is well controlled    F/u 6 weeks    Antoine Poche, M.D.

## 2023-08-11 NOTE — Patient Instructions (Addendum)
Medication Instructions:   Continue all current medications.   Labwork:  none  Testing/Procedures:  Cardiac stress PET scan  Office will contact with results via phone, letter or mychart.    Follow-Up:  6 weeks   Any Other Special Instructions Will Be Listed Below (If Applicable).   If you need a refill on your cardiac medications before your next appointment, please call your pharmacy.

## 2023-08-12 DIAGNOSIS — I890 Lymphedema, not elsewhere classified: Secondary | ICD-10-CM | POA: Diagnosis not present

## 2023-08-13 ENCOUNTER — Ambulatory Visit: Payer: 59 | Admitting: Internal Medicine

## 2023-08-13 DIAGNOSIS — R0609 Other forms of dyspnea: Secondary | ICD-10-CM

## 2023-08-13 LAB — PULMONARY FUNCTION TEST
DL/VA % pred: 106 %
DL/VA: 4.52 ml/min/mmHg/L
DLCO cor % pred: 59 %
DLCO cor: 22.22 ml/min/mmHg
DLCO unc % pred: 59 %
DLCO unc: 22.22 ml/min/mmHg
FEF 25-75 Post: 2.59 L/s
FEF 25-75 Pre: 2.06 L/s
FEF2575-%Change-Post: 25 %
FEF2575-%Pred-Post: 61 %
FEF2575-%Pred-Pre: 48 %
FEV1-%Change-Post: 5 %
FEV1-%Pred-Post: 46 %
FEV1-%Pred-Pre: 44 %
FEV1-Post: 2.34 L
FEV1-Pre: 2.23 L
FEV1FVC-%Change-Post: 1 %
FEV1FVC-%Pred-Pre: 101 %
FEV6-%Change-Post: 2 %
FEV6-%Pred-Post: 46 %
FEV6-%Pred-Pre: 45 %
FEV6-Post: 2.92 L
FEV6-Pre: 2.84 L
FEV6FVC-%Pred-Post: 103 %
FEV6FVC-%Pred-Pre: 103 %
FVC-%Change-Post: 3 %
FVC-%Pred-Post: 45 %
FVC-%Pred-Pre: 43 %
FVC-Post: 2.94 L
FVC-Pre: 2.84 L
Post FEV1/FVC ratio: 80 %
Post FEV6/FVC ratio: 100 %
Pre FEV1/FVC ratio: 78 %
Pre FEV6/FVC Ratio: 100 %

## 2023-08-13 NOTE — Patient Instructions (Signed)
Full PFT performed today. °

## 2023-08-13 NOTE — Progress Notes (Signed)
Full PFT performed today. °

## 2023-08-16 DIAGNOSIS — I890 Lymphedema, not elsewhere classified: Secondary | ICD-10-CM | POA: Diagnosis not present

## 2023-08-18 DIAGNOSIS — I890 Lymphedema, not elsewhere classified: Secondary | ICD-10-CM | POA: Diagnosis not present

## 2023-08-23 DIAGNOSIS — I890 Lymphedema, not elsewhere classified: Secondary | ICD-10-CM | POA: Diagnosis not present

## 2023-08-25 DIAGNOSIS — I890 Lymphedema, not elsewhere classified: Secondary | ICD-10-CM | POA: Diagnosis not present

## 2023-08-26 ENCOUNTER — Encounter (HOSPITAL_COMMUNITY): Payer: Self-pay

## 2023-08-30 DIAGNOSIS — I890 Lymphedema, not elsewhere classified: Secondary | ICD-10-CM | POA: Diagnosis not present

## 2023-08-31 ENCOUNTER — Encounter (HOSPITAL_COMMUNITY)
Admission: RE | Admit: 2023-08-31 | Discharge: 2023-08-31 | Disposition: A | Discharge status: Home / Self Care | Payer: 59 | Source: Ambulatory Visit | Attending: Cardiology | Admitting: Cardiology

## 2023-08-31 DIAGNOSIS — R0609 Other forms of dyspnea: Secondary | ICD-10-CM | POA: Diagnosis not present

## 2023-08-31 DIAGNOSIS — I2510 Atherosclerotic heart disease of native coronary artery without angina pectoris: Secondary | ICD-10-CM

## 2023-08-31 LAB — NM PET CT CARDIAC PERFUSION MULTI W/ABSOLUTE BLOODFLOW
LV dias vol: 132 mL (ref 62–150)
LV sys vol: 54 mL
MBFR: 1.76
Nuc Rest EF: 59 %
Nuc Stress EF: 63 %
Peak HR: 100 {beats}/min
Rest HR: 85 {beats}/min
Rest MBF: 1.2 ml/g/min
Rest Nuclear Isotope Dose: 29.6 mCi
ST Depression (mm): 0 mm
Stress MBF: 2.11 ml/g/min
Stress Nuclear Isotope Dose: 29.4 mCi

## 2023-08-31 MED ORDER — RUBIDIUM RB82 GENERATOR (RUBYFILL)
29.4100 | PACK | Freq: Once | INTRAVENOUS | Status: AC
  Administered 2023-08-31: 29.41 via INTRAVENOUS

## 2023-08-31 MED ORDER — REGADENOSON 0.4 MG/5ML IV SOLN
INTRAVENOUS | Status: AC
  Filled 2023-08-31: qty 5

## 2023-08-31 MED ORDER — REGADENOSON 0.4 MG/5ML IV SOLN
0.4000 mg | Freq: Once | INTRAVENOUS | Status: AC
  Administered 2023-08-31: 0.4 mg via INTRAVENOUS

## 2023-08-31 MED ORDER — RUBIDIUM RB82 GENERATOR (RUBYFILL)
29.5700 | PACK | Freq: Once | INTRAVENOUS | Status: AC
  Administered 2023-08-31: 29.57 via INTRAVENOUS

## 2023-09-01 DIAGNOSIS — I890 Lymphedema, not elsewhere classified: Secondary | ICD-10-CM | POA: Diagnosis not present

## 2023-09-03 DIAGNOSIS — G4733 Obstructive sleep apnea (adult) (pediatric): Secondary | ICD-10-CM | POA: Diagnosis not present

## 2023-09-06 DIAGNOSIS — I890 Lymphedema, not elsewhere classified: Secondary | ICD-10-CM | POA: Diagnosis not present

## 2023-09-07 ENCOUNTER — Encounter: Payer: Self-pay | Admitting: *Deleted

## 2023-09-08 DIAGNOSIS — I890 Lymphedema, not elsewhere classified: Secondary | ICD-10-CM | POA: Diagnosis not present

## 2023-09-10 DIAGNOSIS — H35033 Hypertensive retinopathy, bilateral: Secondary | ICD-10-CM | POA: Diagnosis not present

## 2023-09-13 DIAGNOSIS — I890 Lymphedema, not elsewhere classified: Secondary | ICD-10-CM | POA: Diagnosis not present

## 2023-09-20 DIAGNOSIS — I890 Lymphedema, not elsewhere classified: Secondary | ICD-10-CM | POA: Diagnosis not present

## 2023-09-22 DIAGNOSIS — E1140 Type 2 diabetes mellitus with diabetic neuropathy, unspecified: Secondary | ICD-10-CM | POA: Diagnosis not present

## 2023-09-22 DIAGNOSIS — Z0000 Encounter for general adult medical examination without abnormal findings: Secondary | ICD-10-CM | POA: Diagnosis not present

## 2023-09-22 DIAGNOSIS — R42 Dizziness and giddiness: Secondary | ICD-10-CM | POA: Diagnosis not present

## 2023-09-22 DIAGNOSIS — R209 Unspecified disturbances of skin sensation: Secondary | ICD-10-CM | POA: Diagnosis not present

## 2023-09-22 DIAGNOSIS — I890 Lymphedema, not elsewhere classified: Secondary | ICD-10-CM | POA: Diagnosis not present

## 2023-09-22 DIAGNOSIS — E538 Deficiency of other specified B group vitamins: Secondary | ICD-10-CM | POA: Diagnosis not present

## 2023-09-22 DIAGNOSIS — H539 Unspecified visual disturbance: Secondary | ICD-10-CM | POA: Diagnosis not present

## 2023-09-23 ENCOUNTER — Encounter: Payer: Self-pay | Admitting: Nurse Practitioner

## 2023-09-23 ENCOUNTER — Ambulatory Visit: Payer: 59 | Attending: Nurse Practitioner | Admitting: Nurse Practitioner

## 2023-09-23 VITALS — BP 120/72 | HR 82 | Ht 79.5 in | Wt 376.4 lb

## 2023-09-23 DIAGNOSIS — E785 Hyperlipidemia, unspecified: Secondary | ICD-10-CM

## 2023-09-23 DIAGNOSIS — I10 Essential (primary) hypertension: Secondary | ICD-10-CM | POA: Diagnosis not present

## 2023-09-23 DIAGNOSIS — I2510 Atherosclerotic heart disease of native coronary artery without angina pectoris: Secondary | ICD-10-CM

## 2023-09-23 DIAGNOSIS — I890 Lymphedema, not elsewhere classified: Secondary | ICD-10-CM

## 2023-09-23 DIAGNOSIS — R0609 Other forms of dyspnea: Secondary | ICD-10-CM

## 2023-09-23 NOTE — Patient Instructions (Addendum)

## 2023-09-23 NOTE — Progress Notes (Signed)
Cardiology Office Note:  .   Date:  09/23/2023 ID:  Jethro Bolus, DOB [birth year], MRN [id number] PCP: System, Provider Not In Dr. Jules Husbands -  [facility] Providers Cardiologist:  Dina Rich, MD    History of Present Illness: Marland Kitchen   Johnny Hayes is a 52 y.o. male with a PMH of three vessel CAD, DOE, HTN, HLD, COPD, OSA on BiPAP, T2DM, aortic aneurysm, and lymphedema, who presents today for 6 week follow-up.   Last seen by Dr. Dina Rich on August 11, 2023. Was noted pt was having significant DOE with unclear etiology. NM PET scan was normal.   Today he presents for follow-up with his family. He states he is doing well. DOE is stable. His mother says he is going to see a pulmonologist soon and also will be getting a lymphedema device set up. Currently working with PT. Denies any chest pain, palpitations, syncope, presyncope, dizziness, orthopnea, PND, swelling or significant weight changes, acute bleeding, or claudication.   ROS: Negative. See HPI.  Studies Reviewed: .    NM PET CT cardiac:    The study is normal. The study is low risk.   LV perfusion is normal. There is no evidence of ischemia.   Rest left ventricular function is normal. Rest EF: 59%. Stress left ventricular function is normal. Stress EF: 63%. End diastolic cavity size is normal. End systolic cavity size is normal.   Myocardial blood flow was computed to be 1.29ml/g/min at rest and 2.19ml/g/min at stress. Global myocardial blood flow reserve was 1.76 and was mildly abnormal. Mildly abnormal myocardial blood flow reserve can likely be explained by high resting blood flow due to high resting rate-pressure product (HR 85, SBP 160, RPP 13600). Correcting for elevated RPP at rest, the MBF reserve is 2.67.   Coronary calcium was present on the attenuation correction CT images. Moderate coronary calcifications were present. Coronary calcifications were present in the left anterior descending artery, left circumflex  artery and right coronary artery distribution(s).   Risk Assessment/Calculations:       The 10-year ASCVD risk score (Arnett DK, et al., 2019) is: 29.2%   Values used to calculate the score:     Age: 52 years     Sex: Male     Is Non-Hispanic African American: Yes     Diabetic: Yes     Tobacco smoker: Yes     Systolic Blood Pressure: 138 mmHg     Is BP treated: Yes     HDL Cholesterol: 52 mg/dL     Total Cholesterol: 145 mg/dL      Physical Exam:   VS:  BP 120/72   Pulse 82   Ht 6' 7.5" (2.019 m)   Wt (!) 376 lb 6.4 oz (170.7 kg)   SpO2 94%   BMI 41.87 kg/m    Wt Readings from Last 3 Encounters:  09/23/23 (!) 376 lb 6.4 oz (170.7 kg)  08/11/23 (!) 376 lb (170.6 kg)  06/24/23 (!) 380 lb (172.4 kg)    GEN: Morbidly obese, 52 y.o. male in no acute distress NECK: No JVD; No carotid bruits CARDIAC: S1/S2, RRR, no murmurs, rubs, gallops RESPIRATORY:  Clear to auscultation without rales, wheezing or rhonchi  ABDOMEN: Soft, non-tender, non-distended EXTREMITIES:  lymphedema noted to BLE; No deformity   ASSESSMENT AND PLAN: .    CAD Stable with no anginal symptoms. No indication for ischemic evaluation. Continues to admit to stable DOE, etiology not cardiac NM PET stress test  noted above, study was normal. No indication for further cardiac testing. Continue current medication regimen. Heart healthy diet and regular cardiovascular exercise encouraged. Care and ED precautions discussed.   HTN BP stable. Discussed to monitor BP at home at least 2 hours after medications and sitting for 5-10 minutes. No medication changes at this time. Heart healthy diet and regular cardiovascular exercise encouraged. Continue to follow with PCP.  HLD LDL 79 06/2023. Pt defers labs at this time to his PCP. Continue current medication regimen. PCP to manage. Continue to follow with PCP.  4. DOE Chronic and stable per his report. Does have diagnosis of COPD. Scheduled to see a pulmonologist soon. No  medication changes at this time. F/U with PCP and pulmonology. Care and ED precautions dicussed.   5. Lymphedema Stable per his report. Continue to follow-up with Lymphedema clinic and PCP. Use device as instructed when available.   6. Morbid obesity Weight loss via diet and exercise encouraged. Discussed the impact being overweight would have on cardiovascular risk.  Dispo: Follow-up with Dr. Levora Angel or APP in 6 months or sooner if anything changes.  Signed, Sharlene Dory, NP

## 2023-09-24 ENCOUNTER — Ambulatory Visit (INDEPENDENT_AMBULATORY_CARE_PROVIDER_SITE_OTHER): Payer: 59 | Admitting: Internal Medicine

## 2023-09-24 ENCOUNTER — Encounter: Payer: Self-pay | Admitting: Internal Medicine

## 2023-09-24 VITALS — BP 138/68 | HR 89 | Ht 79.5 in | Wt 374.0 lb

## 2023-09-24 DIAGNOSIS — F17210 Nicotine dependence, cigarettes, uncomplicated: Secondary | ICD-10-CM | POA: Diagnosis not present

## 2023-09-24 DIAGNOSIS — R0609 Other forms of dyspnea: Secondary | ICD-10-CM

## 2023-09-24 NOTE — Progress Notes (Signed)
Johnny Hayes, male    DOB: [birth year]    MRN: [id number]   Brief patient profile:  52 yobm active smoker/Morbidly obese   referred to pulmonary clinic in [place]  06/24/2023 by Juanetta Gosling p neg cardiac  w/u for doe x ?  2022    Wt was 359 on 04/20/2019   History of Present Illness  06/24/2023  Pulmonary/ 1st office eval/ Mazen Marcin / [facility] [place] [facility] @ 380 lbs  Chief Complaint  Patient presents with   Establish Care   Shortness of Breath  Dyspnea:  mb and back 41ft slt  uphill to house stops half way due dizziness  Walks at grocery store slower pace than others = MMRC2 = can't walk a nl pace on a flat grade s sob but does fine slow  Cough: not much  Sleep: on bipap and flat bed x 2 pillows s resp cc / wakes up feeling refreshed  SABA use: not sure if helps  02: none  Lung cancer screen: needed 05/2024  Rec To get the most out of exercise, you need to be continuously aware that you are short of breath, but never out of breath,  building up to at least 30 minutes daily  Only use your albuterol as a rescue medication   Also  Ok to try albuterol x  puffs x 15 min before an activity (on alternating days)  that you know would usually make you short of breath   The key is to stop smoking completely before smoking completely stops you!   Please schedule a follow up visit in 3 months but call sooner if needed with PFTs on return     09/24/2023  f/u ov/[place] office/Raelynne Ludwick re: MO / low erv re maint on no resp rx   Chief Complaint  Patient presents with   Follow-up    Breathing is unchanged. He is using his albuterol about once per day. C/o non prod cough.    Dyspnea:  mb to house still stop half way and limited by L knee  Cough: no problem  Sleeping: Bipap x 15 y bed is flat / 2 pillows no   resp cc  SABA use: rarely  02: none   Lung cancer screening:  had neg CT cardiac 05/31/23    No obvious day to day or daytime variability or assoc excess/ purulent sputum or mucus plugs or  hemoptysis or cp or chest tightness, subjective wheeze or overt sinus or hb symptoms.    Also denies any obvious fluctuation of symptoms with weather or environmental changes or other aggravating or alleviating factors except as outlined above   No unusual exposure hx or h/o childhood pna/ asthma or knowledge of premature birth.  Current Allergies, Complete Past Medical History, Past Surgical History, Family History, and Social History were reviewed in [employer] record.  ROS  The following are not active complaints unless bolded Hoarseness, sore throat, dysphagia, dental problems, itching, sneezing,  nasal congestion or discharge of excess mucus or purulent secretions, ear ache,   fever, chills, sweats, unintended wt loss or wt gain, classically pleuritic or exertional cp,  orthopnea pnd or arm/hand swelling  or leg swelling, presyncope, palpitations, abdominal pain, anorexia, nausea, vomiting, diarrhea  or change in bowel habits or change in bladder habits, change in stools or change in urine, dysuria, hematuria,  rash, arthralgias, visual complaints, headache, numbness, weakness or ataxia or problems with walking or coordination,  change in mood or  memory.  Current Meds  Medication Sig   albuterol (VENTOLIN HFA) 108 (90 Base) MCG/ACT inhaler Inhale 1-2 puffs into the lungs every 6 (six) hours as needed for wheezing or shortness of breath.   amLODipine (NORVASC) 5 MG tablet Take 5 mg by mouth daily.   aspirin EC 81 MG tablet Take 1 tablet by mouth daily.   atorvastatin (LIPITOR) 20 MG tablet Take 20 mg by mouth daily.   baclofen (LIORESAL) 10 MG tablet Take 10 mg by mouth 3 (three) times daily as needed.   [employer] VITAMIN B12 1000 MCG tablet Take 1,000 mcg by mouth daily.   gabapentin (NEURONTIN) 300 MG capsule Take 300 mg by mouth 3 (three) times daily.   ibuprofen (ADVIL) 600 MG tablet Take 1 tablet (600 mg total) by mouth every 6 (six) hours as needed.    isosorbide mononitrate (IMDUR) 30 MG 24 hr tablet Take 1 tablet by mouth daily.   metFORMIN (GLUCOPHAGE-XR) 500 MG 24 hr tablet Take 500 mg by mouth daily with breakfast.   naproxen sodium (ALEVE) 220 MG tablet Take 220-440 mg by mouth 2 (two) times daily as needed (pain).   Omega-3 1000 MG CAPS Take by mouth.            Past Medical History:  Diagnosis Date   Development delay    "no longer takes depakote"   Hypertension    Osteoarthritis of knee    left   Seizures (HCC)    Sz. as a child. Last sz. at age 18, per mother   Sleep apnea       Objective:     Wt Readings from Last 3 Encounters:  09/24/23 (!) 374 lb (169.6 kg)  09/23/23 (!) 376 lb 6.4 oz (170.7 kg)  08/11/23 (!) 376 lb (170.6 kg)      Vital signs reviewed  09/24/2023  - Note at rest 02 sats  99% on RA   General appearance:    amb MO (by BMI) bm nad    HEENT : Oropharynx  clear/ no teetch      Nasal turbinates nl    NECK :  without  apparent JVD/ palpable Nodes/TM    LUNGS: no acc muscle use,  Nl contour chest which is clear to A and P bilaterally without cough on insp or exp maneuvers   CV:  RRR  no s3 or murmur or increase in P2, and no significant  edema   ABD:  obese soft and nontender with limited insp excursion semi-supine   MS:  Nl gait/ ext warm without deformities Or obvious joint restrictions  calf tenderness, cyanosis or clubbing    SKIN: warm and dry without lesions    NEURO:  alert, approp, nl sensorium with  no motor or cerebellar deficits apparent.          Assessment

## 2023-09-24 NOTE — Assessment & Plan Note (Addendum)
PFTs 08/13/23  restrictive pattern with  ERV 22%  at wt 375  c/w effects of obesity   Body mass index is 41.6 kg/m.  -  trending up  No results found for: "TSH"    Contributing to doe and risk of GERD >>>   reviewed the need and the process to achieve and maintain neg calorie balance > defer f/u primary care including intermittently monitoring thyroid status

## 2023-09-24 NOTE — Patient Instructions (Addendum)
Only use your albuterol as a rescue medication to be used if you can't catch your breath by resting or doing a relaxed purse lip breathing pattern.  - The less you use it, the better it will work when you need it. - Ok to use up to 2 puffs  every 4 hours if you must but call for immediate appointment if use goes up over your usual need - Don't leave home without it !!  (think of it like the spare tire for your car)   Also  Ok to try albuterol 15 min before an activity (on alternating days)  that you know would usually make you short of breath and see if it makes any difference and if makes none then don't take albuterol after activity unless you can't catch your breath as this means it's the resting that helps, not the albuterol.    Pulmonary follow up can be as needed

## 2023-09-24 NOTE — Assessment & Plan Note (Signed)
Active smoker started ? 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 2) If the pt is convinced, as many are, that saba helps recover from activity faster then it's easy to tell if this is the case by re-challenging : ie stop, take the inhaler, then p 5 minutes try the exact same activity (intensity of workload) that just caused the symptoms and see if they are substantially diminished or not after saba 3) if there is an activity that reproducibly causes the symptoms, try the saba 15 min before the activity on alternate days   If in fact the saba really does help, then fine to continue to use it prn but advised may need to look closer at the maintenance regimen being used to achieve better control of airways disease with exertion.   Pulmonary f/u Is prn

## 2023-09-24 NOTE — Assessment & Plan Note (Signed)
4-5 min discussion re active cigarette smoking in addition to office E&M  Ask about tobacco use:   ongoing Advise quitting   pfts reviewed, not too late for him to quit as he has no copd but is still risking lung ca /ascvd Assess willingness:  Not committed at this point Assist in quit attempt:  Per PCP when ready Arrange follow up:   Follow up per Primary Care planned   Rec Yearly LDSCT unless already having f/u CT on his TA   Each maintenance medication was reviewed in detail including emphasizing most importantly the difference between maintenance and prns and under what circumstances the prns are to be triggered using an action plan format where appropriate.  Total time for H and P, chart review, counseling, reviewing hfa device(s) , directly observing portions of ambulatory 02 saturation study/ and generating customized AVS unique to this office visit / same day charting = 30 min summary final f/u ov

## 2023-09-27 DIAGNOSIS — I890 Lymphedema, not elsewhere classified: Secondary | ICD-10-CM | POA: Diagnosis not present

## 2023-09-29 DIAGNOSIS — I890 Lymphedema, not elsewhere classified: Secondary | ICD-10-CM | POA: Diagnosis not present

## 2023-10-04 DIAGNOSIS — I890 Lymphedema, not elsewhere classified: Secondary | ICD-10-CM | POA: Diagnosis not present

## 2023-10-11 DIAGNOSIS — I890 Lymphedema, not elsewhere classified: Secondary | ICD-10-CM | POA: Diagnosis not present

## 2023-10-18 DIAGNOSIS — I890 Lymphedema, not elsewhere classified: Secondary | ICD-10-CM | POA: Diagnosis not present

## 2023-10-20 DIAGNOSIS — I890 Lymphedema, not elsewhere classified: Secondary | ICD-10-CM | POA: Diagnosis not present

## 2023-10-21 DIAGNOSIS — R42 Dizziness and giddiness: Secondary | ICD-10-CM | POA: Diagnosis not present

## 2023-10-21 DIAGNOSIS — I872 Venous insufficiency (chronic) (peripheral): Secondary | ICD-10-CM | POA: Diagnosis not present

## 2023-10-21 DIAGNOSIS — E785 Hyperlipidemia, unspecified: Secondary | ICD-10-CM | POA: Diagnosis not present

## 2023-10-21 DIAGNOSIS — I10 Essential (primary) hypertension: Secondary | ICD-10-CM | POA: Diagnosis not present

## 2023-10-21 DIAGNOSIS — E538 Deficiency of other specified B group vitamins: Secondary | ICD-10-CM | POA: Diagnosis not present

## 2023-10-21 DIAGNOSIS — I700 Atherosclerosis of aorta: Secondary | ICD-10-CM | POA: Diagnosis not present

## 2023-10-21 DIAGNOSIS — E1140 Type 2 diabetes mellitus with diabetic neuropathy, unspecified: Secondary | ICD-10-CM | POA: Diagnosis not present

## 2023-10-21 DIAGNOSIS — E782 Mixed hyperlipidemia: Secondary | ICD-10-CM | POA: Diagnosis not present

## 2023-10-21 DIAGNOSIS — I7121 Aneurysm of the ascending aorta, without rupture: Secondary | ICD-10-CM | POA: Diagnosis not present

## 2023-10-25 DIAGNOSIS — I890 Lymphedema, not elsewhere classified: Secondary | ICD-10-CM | POA: Diagnosis not present

## 2023-10-28 DIAGNOSIS — I890 Lymphedema, not elsewhere classified: Secondary | ICD-10-CM | POA: Diagnosis not present

## 2023-11-01 DIAGNOSIS — G4733 Obstructive sleep apnea (adult) (pediatric): Secondary | ICD-10-CM | POA: Diagnosis not present

## 2023-11-08 DIAGNOSIS — I890 Lymphedema, not elsewhere classified: Secondary | ICD-10-CM | POA: Diagnosis not present

## 2023-11-10 DIAGNOSIS — I890 Lymphedema, not elsewhere classified: Secondary | ICD-10-CM | POA: Diagnosis not present

## 2023-11-16 DIAGNOSIS — G4733 Obstructive sleep apnea (adult) (pediatric): Secondary | ICD-10-CM | POA: Diagnosis not present

## 2023-11-18 DIAGNOSIS — I890 Lymphedema, not elsewhere classified: Secondary | ICD-10-CM | POA: Diagnosis not present

## 2023-11-25 DIAGNOSIS — I890 Lymphedema, not elsewhere classified: Secondary | ICD-10-CM | POA: Diagnosis not present

## 2023-11-29 DIAGNOSIS — I890 Lymphedema, not elsewhere classified: Secondary | ICD-10-CM | POA: Diagnosis not present

## 2023-12-06 DIAGNOSIS — I890 Lymphedema, not elsewhere classified: Secondary | ICD-10-CM | POA: Diagnosis not present

## 2023-12-08 DIAGNOSIS — I890 Lymphedema, not elsewhere classified: Secondary | ICD-10-CM | POA: Diagnosis not present

## 2023-12-17 DIAGNOSIS — G4733 Obstructive sleep apnea (adult) (pediatric): Secondary | ICD-10-CM | POA: Diagnosis not present

## 2024-01-06 ENCOUNTER — Other Ambulatory Visit: Payer: Self-pay | Admitting: Family Medicine

## 2024-01-06 DIAGNOSIS — H539 Unspecified visual disturbance: Secondary | ICD-10-CM

## 2024-01-06 DIAGNOSIS — R209 Unspecified disturbances of skin sensation: Secondary | ICD-10-CM

## 2024-01-06 DIAGNOSIS — R42 Dizziness and giddiness: Secondary | ICD-10-CM

## 2024-01-14 DIAGNOSIS — G4733 Obstructive sleep apnea (adult) (pediatric): Secondary | ICD-10-CM | POA: Diagnosis not present

## 2024-01-24 ENCOUNTER — Ambulatory Visit
Admission: RE | Admit: 2024-01-24 | Discharge: 2024-01-24 | Disposition: A | Discharge status: Home / Self Care | Source: Ambulatory Visit | Attending: Family Medicine | Admitting: Family Medicine

## 2024-01-24 DIAGNOSIS — H538 Other visual disturbances: Secondary | ICD-10-CM | POA: Diagnosis not present

## 2024-01-24 DIAGNOSIS — H539 Unspecified visual disturbance: Secondary | ICD-10-CM

## 2024-01-24 DIAGNOSIS — R209 Unspecified disturbances of skin sensation: Secondary | ICD-10-CM

## 2024-01-24 DIAGNOSIS — R42 Dizziness and giddiness: Secondary | ICD-10-CM | POA: Diagnosis not present

## 2024-02-02 DIAGNOSIS — R42 Dizziness and giddiness: Secondary | ICD-10-CM | POA: Diagnosis not present

## 2024-02-02 DIAGNOSIS — G4733 Obstructive sleep apnea (adult) (pediatric): Secondary | ICD-10-CM | POA: Diagnosis not present

## 2024-02-02 DIAGNOSIS — I890 Lymphedema, not elsewhere classified: Secondary | ICD-10-CM | POA: Diagnosis not present

## 2024-02-02 DIAGNOSIS — I872 Venous insufficiency (chronic) (peripheral): Secondary | ICD-10-CM | POA: Diagnosis not present

## 2024-02-02 DIAGNOSIS — E785 Hyperlipidemia, unspecified: Secondary | ICD-10-CM | POA: Diagnosis not present

## 2024-02-02 DIAGNOSIS — R209 Unspecified disturbances of skin sensation: Secondary | ICD-10-CM | POA: Diagnosis not present

## 2024-02-02 DIAGNOSIS — I10 Essential (primary) hypertension: Secondary | ICD-10-CM | POA: Diagnosis not present

## 2024-02-02 DIAGNOSIS — E1169 Type 2 diabetes mellitus with other specified complication: Secondary | ICD-10-CM | POA: Diagnosis not present

## 2024-02-02 DIAGNOSIS — E1140 Type 2 diabetes mellitus with diabetic neuropathy, unspecified: Secondary | ICD-10-CM | POA: Diagnosis not present

## 2024-02-02 DIAGNOSIS — R6250 Unspecified lack of expected normal physiological development in childhood: Secondary | ICD-10-CM | POA: Diagnosis not present

## 2024-02-02 DIAGNOSIS — H539 Unspecified visual disturbance: Secondary | ICD-10-CM | POA: Diagnosis not present

## 2024-02-15 DIAGNOSIS — G4733 Obstructive sleep apnea (adult) (pediatric): Secondary | ICD-10-CM | POA: Diagnosis not present

## 2024-03-09 DIAGNOSIS — H40011 Open angle with borderline findings, low risk, right eye: Secondary | ICD-10-CM | POA: Diagnosis not present

## 2024-03-23 ENCOUNTER — Ambulatory Visit: Payer: 59 | Attending: Nurse Practitioner | Admitting: Nurse Practitioner

## 2024-03-23 ENCOUNTER — Encounter: Payer: Self-pay | Admitting: Nurse Practitioner

## 2024-03-23 ENCOUNTER — Telehealth: Payer: Self-pay | Admitting: Nurse Practitioner

## 2024-03-23 VITALS — BP 140/80 | HR 78 | Ht 78.5 in | Wt 369.0 lb

## 2024-03-23 DIAGNOSIS — I890 Lymphedema, not elsewhere classified: Secondary | ICD-10-CM

## 2024-03-23 DIAGNOSIS — E1140 Type 2 diabetes mellitus with diabetic neuropathy, unspecified: Secondary | ICD-10-CM | POA: Diagnosis not present

## 2024-03-23 DIAGNOSIS — I7121 Aneurysm of the ascending aorta, without rupture: Secondary | ICD-10-CM

## 2024-03-23 DIAGNOSIS — R0609 Other forms of dyspnea: Secondary | ICD-10-CM | POA: Diagnosis not present

## 2024-03-23 DIAGNOSIS — I2510 Atherosclerotic heart disease of native coronary artery without angina pectoris: Secondary | ICD-10-CM | POA: Diagnosis not present

## 2024-03-23 DIAGNOSIS — I10 Essential (primary) hypertension: Secondary | ICD-10-CM | POA: Diagnosis not present

## 2024-03-23 DIAGNOSIS — E782 Mixed hyperlipidemia: Secondary | ICD-10-CM

## 2024-03-23 DIAGNOSIS — E785 Hyperlipidemia, unspecified: Secondary | ICD-10-CM | POA: Diagnosis not present

## 2024-03-23 DIAGNOSIS — Z758 Other problems related to medical facilities and other health care: Secondary | ICD-10-CM | POA: Diagnosis not present

## 2024-03-23 MED ORDER — AMLODIPINE BESYLATE 10 MG PO TABS: 10.0000 mg | ORAL_TABLET | Freq: Every day | ORAL | 1 refills | Status: DC

## 2024-03-23 NOTE — Patient Instructions (Addendum)
 Medication Instructions:  Your physician has recommended you make the following change in your medication:  Please increase Amlodipine to 10 Mg daily   Labwork: In 1 week at [facility]  Testing/Procedures: Non-Cardiac CT scanning, (CAT scanning), is a noninvasive, special x-ray that produces cross-sectional images of the body using x-rays and a computer. CT scans help physicians diagnose and treat medical conditions. For some CT exams, a contrast material is used to enhance visibility in the area of the body being studied. CT scans provide greater clarity and reveal more details than regular x-ray exams.  Follow-Up: Your physician recommends that you schedule a follow-up appointment in: 6 months   Any Other Special Instructions Will Be Listed Below (If Applicable).  If you need a refill on your cardiac medications before your next appointment, please call your pharmacy.   (Patient Name)-_____________________________  (MRN)-_________________________     (Physician)-_________________________________   (DATE) -_________ (Blood Pressure)-_________  (Heart Rate)-_________    (DATE) -_________ (Blood Pressure)-_________  (Heart Rate)-_________    (DATE) -_________ (Blood Pressure)-_________  (Heart Rate)-_________   (DATE) -_________ (Blood Pressure)-_________  (Heart Rate)-_________    (DATE) -_________ (Blood Pressure)-_________  (Heart Rate)-_________    (DATE) -_________ (Blood Pressure)-_________  (Heart Rate)-_________   (DATE) -_________ (Blood Pressure)-_________  (Heart Rate)-_________    (DATE) -_________ (Blood Pressure)-_________  (Heart Rate)-_________    (DATE) -_________ (Blood Pressure)-_________  (Heart Rate)-_________    (DATE) -_________ (Blood Pressure)-_________  (Heart Rate)-_________    (DATE) -_________ (Blood Pressure)-_________  (Heart Rate)-_________    (DATE) -_________ (Blood Pressure)-_________  (Heart  Rate)-_________    (DATE) -_________ (Blood Pressure)-_________  (Heart Rate)-_________    (DATE) -_________ (Blood Pressure)-_________  (Heart Rate)-_________

## 2024-03-23 NOTE — Progress Notes (Unsigned)
 Cardiology Office Note:  .   Date:  03/23/2024 ID:  Johnny Hayes, DOB Apr 15, 1971, MRN [id number] PCP: System, Provider Not In Dr. Isiah Mare -  [facility] Providers Cardiologist:  Armida Lander, MD    History of Present Illness: Johnny Hayes   Johnny Hayes is a 53 y.o. male with a PMH of three vessel CAD, DOE, HTN, HLD, COPD, OSA on BiPAP, T2DM, aortic aneurysm, and lymphedema, who presents today for 6 week follow-up.   Last seen by Dr. Armida Lander on August 11, 2023. Was noted pt was having significant DOE with unclear etiology. NM PET scan was normal.   09/23/2023 - Today he presents for follow-up with his family. He states he is doing well. DOE is stable. His mother says he is going to see a pulmonologist soon and also will be getting a lymphedema device set up. Currently working with PT. Denies any chest pain, palpitations, syncope, presyncope, dizziness, orthopnea, PND, swelling or significant weight changes, acute bleeding, or claudication.  03/23/2024 - He presents today for follow-up.  He is accompanied by his mom today during office visit. Doing well and denies any cardiac complaints or issues. He is also due for labs. Mother is wanting to know about a referral to a PCP. Denies any chest pain, shortness of breath, palpitations, syncope, presyncope, dizziness, orthopnea, PND, swelling or significant weight changes, acute bleeding, or claudication. Complaint with lymphedema treatments.   ROS: Negative. See HPI.  Studies Reviewed: Johnny Hayes    EKG: EKG Interpretation Date/Time:  Thursday Mar 23 2024 10:30:21 EDT Ventricular Rate:  77 PR Interval:  166 QRS Duration:  128 QT Interval:  408 QTC Calculation: 461 R Axis:   16  Text Interpretation: Sinus rhythm with Premature supraventricular complexes Right bundle branch block When compared with ECG of 16-May-2023 20:15, PREVIOUS ECG IS PRESENT Confirmed by Lasalle Pointer [phone]) on 03/28/2024 2:22:02 PM    NM PET CT cardiac:    The study  is normal. The study is low risk.   LV perfusion is normal. There is no evidence of ischemia.   Rest left ventricular function is normal. Rest EF: 59%. Stress left ventricular function is normal. Stress EF: 63%. End diastolic cavity size is normal. End systolic cavity size is normal.   Myocardial blood flow was computed to be 1.83ml/g/min at rest and 2.11ml/g/min at stress. Global myocardial blood flow reserve was 1.76 and was mildly abnormal. Mildly abnormal myocardial blood flow reserve can likely be explained by high resting blood flow due to high resting rate-pressure product (HR 85, SBP 160, RPP 13600). Correcting for elevated RPP at rest, the MBF reserve is 2.67.   Coronary calcium was present on the attenuation correction CT images. Moderate coronary calcifications were present. Coronary calcifications were present in the left anterior descending artery, left circumflex artery and right coronary artery distribution(s).   Risk Assessment/Calculations:    The 10-year ASCVD risk score (Arnett DK, et al., 2019) is: 32.3%   Values used to calculate the score:     Age: 21 years     Sex: Male     Is Non-Hispanic African American: Yes     Diabetic: Yes     Tobacco smoker: Yes     Systolic Blood Pressure: 140 mmHg     Is BP treated: Yes     HDL Cholesterol: 55 mg/dL     Total Cholesterol: 210 mg/dL      Physical Exam:   VS:  BP (!) 140/80  Pulse 78   Ht 6' 6.5" (1.994 m)   Wt (!) 369 lb (167.4 kg)   SpO2 94%   BMI 42.10 kg/m    Wt Readings from Last 3 Encounters:  03/23/24 (!) 369 lb (167.4 kg)  09/24/23 (!) 374 lb (169.6 kg)  09/23/23 (!) 376 lb 6.4 oz (170.7 kg)    GEN: Morbidly obese, 53 y.o. male in no acute distress NECK: No JVD; No carotid bruits CARDIAC: S1/S2, RRR, no murmurs, rubs, gallops RESPIRATORY:  Clear to auscultation without rales, wheezing or rhonchi  ABDOMEN: Soft, non-tender, non-distended EXTREMITIES:  lymphedema noted to BLE; No deformity   ASSESSMENT AND  PLAN: .    CAD Stable with no anginal symptoms. No indication for ischemic evaluation. Past cardiac NM PET stress test noted above, study was normal. No indication for further cardiac testing. Continue current medication regimen. Heart healthy diet and regular cardiovascular exercise encouraged. Care and ED precautions discussed. Will obtain FLP, CMET, and CBC.   HTN SBP mildly elevated on arrival and still mildly elevated during recheck. SBP goal < 130. Discussed to monitor BP at home at least 2 hours after medications and sitting for 5-10 minutes. No medication changes at this time. Heart healthy diet and regular cardiovascular exercise encouraged. Continue to follow with PCP.  HLD LDL 79 06/2023. Pt defers labs at this time to his PCP. Continue current medication regimen. PCP to manage. Continue to follow with PCP. Will obtain FLP and CMET.   4. DOE Chronic and stable per his report. Does have diagnosis of COPD. No medication changes at this time. F/U with PCP and pulmonology. Care and ED precautions dicussed. Will obtain CBC.   5. Ascending aortic aneurysm He is due for repeat imaging in July 2025.  Previous imaging revealed coronary CT scan in July 2024 that showed a 4.1 cm ascending aortic aneurysm.  Was recommended repeat CTA in July 2025 and we will arrange.  6. Lymphedema Stable per his report. Continue to follow-up with Lymphedema clinic and PCP. Use device as instructed when available.   7. Morbid obesity, type 2 diabetes Weight loss via diet and exercise encouraged. Discussed the impact being overweight would have on cardiovascular risk. Will obtain A1C, TSH.  Will consult clinical pharm D regarding Ozempic .   8. Does not have a PCP Patient and his mother are agreeable to referral to PCP and will arrange this for him today.   Dispo: Follow-up with Dr. Armida Lander or APP in 6 months or sooner if anything changes.  Signed, Lasalle Pointer, NP

## 2024-03-23 NOTE — Telephone Encounter (Signed)
 Checking percert on the following patient for testing scheduled at  [facility]     CT Angio Chest  03/31/2024

## 2024-03-28 ENCOUNTER — Other Ambulatory Visit (HOSPITAL_COMMUNITY)
Admission: RE | Admit: 2024-03-28 | Discharge: 2024-03-28 | Disposition: A | Discharge status: Home / Self Care | Source: Ambulatory Visit | Attending: Nurse Practitioner | Admitting: Nurse Practitioner

## 2024-03-28 ENCOUNTER — Encounter: Payer: Self-pay | Admitting: Pharmacist

## 2024-03-28 DIAGNOSIS — R0609 Other forms of dyspnea: Secondary | ICD-10-CM | POA: Diagnosis not present

## 2024-03-28 DIAGNOSIS — E785 Hyperlipidemia, unspecified: Secondary | ICD-10-CM | POA: Diagnosis not present

## 2024-03-28 DIAGNOSIS — E119 Type 2 diabetes mellitus without complications: Secondary | ICD-10-CM | POA: Insufficient documentation

## 2024-03-28 DIAGNOSIS — I2510 Atherosclerotic heart disease of native coronary artery without angina pectoris: Secondary | ICD-10-CM | POA: Diagnosis not present

## 2024-03-28 DIAGNOSIS — I10 Essential (primary) hypertension: Secondary | ICD-10-CM | POA: Insufficient documentation

## 2024-03-28 DIAGNOSIS — E782 Mixed hyperlipidemia: Secondary | ICD-10-CM | POA: Insufficient documentation

## 2024-03-28 LAB — HEMOGLOBIN A1C
Hgb A1c MFr Bld: 6.3 % — ABNORMAL HIGH (ref 4.8–5.6)
Mean Plasma Glucose: 134.11 mg/dL

## 2024-03-28 LAB — LIPID PANEL
Cholesterol: 210 mg/dL — ABNORMAL HIGH (ref 0–200)
HDL: 55 mg/dL (ref >40)
LDL Cholesterol: 143 mg/dL — ABNORMAL HIGH (ref 0–99)
Total CHOL/HDL Ratio: 3.8 ratio
Triglycerides: 62 mg/dL (ref <150)
VLDL: 12 mg/dL (ref 0–40)

## 2024-03-28 LAB — COMPREHENSIVE METABOLIC PANEL WITH GFR
ALT: 21 U/L (ref 0–44)
AST: 35 U/L (ref 15–41)
Albumin: 3.9 g/dL (ref 3.5–5.0)
Alkaline Phosphatase: 90 U/L (ref 38–126)
Anion gap: 10 (ref 5–15)
BUN: 11 mg/dL (ref 6–20)
CO2: 25 mmol/L (ref 22–32)
Calcium: 9 mg/dL (ref 8.9–10.3)
Chloride: 102 mmol/L (ref 98–111)
Creatinine, Ser: 0.96 mg/dL (ref 0.61–1.24)
GFR, Estimated: >60 mL/min (ref >60)
Glucose, Bld: 124 mg/dL — ABNORMAL HIGH (ref 70–99)
Potassium: 4.3 mmol/L (ref 3.5–5.1)
Sodium: 137 mmol/L (ref 135–145)
Total Bilirubin: 0.6 mg/dL (ref 0.0–1.2)
Total Protein: 7.6 g/dL (ref 6.5–8.1)

## 2024-03-28 LAB — CBC
HCT: 50.3 % (ref 39.0–52.0)
Hemoglobin: 16.6 g/dL (ref 13.0–17.0)
MCH: 29.7 pg (ref 26.0–34.0)
MCHC: 33 g/dL (ref 30.0–36.0)
MCV: 90 fL (ref 80.0–100.0)
Platelets: 227 10*3/uL (ref 150–400)
RBC: 5.59 MIL/uL (ref 4.22–5.81)
RDW: 13 % (ref 11.5–15.5)
WBC: 6.8 10*3/uL (ref 4.0–10.5)
nRBC: 0 % (ref 0.0–0.2)

## 2024-03-28 LAB — TSH: TSH: 2.302 u[IU]/mL (ref 0.350–4.500)

## 2024-03-29 ENCOUNTER — Telehealth: Payer: Self-pay | Admitting: Pharmacy Technician

## 2024-03-29 ENCOUNTER — Other Ambulatory Visit (HOSPITAL_COMMUNITY): Payer: Self-pay

## 2024-03-29 NOTE — Telephone Encounter (Signed)
 Pharmacy Patient Advocate Encounter   Received notification from Physician's Office that prior authorization for Ozempic is required/requested.   Insurance verification completed.   The patient is insured through [employer] .   Per test claim: The current 03/29/24 day co-pay is, $0.00- one month.  No PA needed at this time. This test claim was processed through [facility]- copay amounts may vary at other pharmacies due to pharmacy/plan contracts, or as the patient moves through the different stages of their insurance plan.

## 2024-03-30 ENCOUNTER — Telehealth: Payer: Self-pay | Admitting: Nurse Practitioner

## 2024-03-30 MED ORDER — OZEMPIC (0.25 OR 0.5 MG/DOSE) 2 MG/1.5ML ~~LOC~~ SOPN: 0.2500 mg | PEN_INJECTOR | SUBCUTANEOUS | 0 refills | Status: DC

## 2024-03-30 MED ORDER — OZEMPIC (0.25 OR 0.5 MG/DOSE) 2 MG/1.5ML ~~LOC~~ SOPN: 0.5000 mg | PEN_INJECTOR | SUBCUTANEOUS | 0 refills | Status: DC

## 2024-03-30 NOTE — Telephone Encounter (Signed)
 Patients mother informed and verbalized understanding of plan. Patient is willing to start ozempic  Dosing for weeks 1-8 have been sent to pharmacy

## 2024-03-30 NOTE — Telephone Encounter (Signed)
-----   Message from Lasalle Pointer sent at 03/30/2024 11:03 AM EDT ----- Patient is covered with Ozempic. He is good to start this.  Let's go ahead and start Ozempic 0.25 mg subcutaneous injection once per week 4 weeks 1 through 4 and increase dose to 0.5 mg subcutaneous injection once per week for weeks 5 through 8.  Once he gets close to being done with week 8 he needs to let us  know.  If he needs to be brought in for RN visit to be shown how to do this, this would be fine to arrange.  Thanks!   Best, Lasalle Pointer, NP ----- Message ----- From: Sunny English, [facility] Sent: 03/30/2024   8:23 AM EDT To: Lasalle Pointer, NP  No PA needed and medication is covered for 0.00 dollars a month ----- Message ----- From: Lynett Sarah, CPhT Sent: 03/29/2024  12:39 PM EDT To: Christopher Pavero, RPH  No PA needed at this time.$0.00 for one month ----- Message ----- From: Sunny English, [facility] Sent: 03/28/2024   4:49 PM EDT To: Rx Prior Auth Team  Please complete PA for Ozempic ----- Message ----- From: Lasalle Pointer, NP Sent: 03/28/2024   4:29 PM EDT To: Sunny English, RPH  Thanks, Larinda Plover! That would be fantastic!   Best, Lasalle Pointer, NP ----- Message ----- From: Sunny English, [facility] Sent: 03/28/2024   4:23 PM EDT To: Lasalle Pointer, NP  Yes, this one will likely be covered. May not even need a PA. Need us  to check? ----- Message ----- From: Lasalle Pointer, NP Sent: 03/28/2024   2:32 PM EDT To: [facility]  Wanted to check to see if patient's insurance will cover Ozempic.   Thanks!   Best, Lasalle Pointer, NP

## 2024-03-31 ENCOUNTER — Ambulatory Visit (HOSPITAL_COMMUNITY)
Admission: RE | Admit: 2024-03-31 | Discharge: 2024-03-31 | Disposition: A | Discharge status: Home / Self Care | Source: Ambulatory Visit | Attending: Nurse Practitioner | Admitting: Nurse Practitioner

## 2024-03-31 DIAGNOSIS — I7121 Aneurysm of the ascending aorta, without rupture: Secondary | ICD-10-CM | POA: Insufficient documentation

## 2024-03-31 DIAGNOSIS — J9811 Atelectasis: Secondary | ICD-10-CM | POA: Diagnosis not present

## 2024-03-31 MED ORDER — IOHEXOL 350 MG/ML SOLN
100.0000 mL | Freq: Once | INTRAVENOUS | Status: AC | PRN
  Administered 2024-03-31: 100 mL via INTRAVENOUS

## 2024-04-02 DIAGNOSIS — G4733 Obstructive sleep apnea (adult) (pediatric): Secondary | ICD-10-CM | POA: Diagnosis not present

## 2024-04-03 ENCOUNTER — Ambulatory Visit: Payer: Self-pay | Admitting: Nurse Practitioner

## 2024-04-06 ENCOUNTER — Encounter: Payer: Self-pay | Admitting: *Deleted

## 2024-04-14 ENCOUNTER — Other Ambulatory Visit: Payer: Self-pay | Admitting: *Deleted

## 2024-04-14 ENCOUNTER — Encounter: Payer: Self-pay | Admitting: *Deleted

## 2024-04-14 DIAGNOSIS — I2510 Atherosclerotic heart disease of native coronary artery without angina pectoris: Secondary | ICD-10-CM

## 2024-04-14 DIAGNOSIS — Z79899 Other long term (current) drug therapy: Secondary | ICD-10-CM

## 2024-04-14 DIAGNOSIS — E782 Mixed hyperlipidemia: Secondary | ICD-10-CM

## 2024-04-14 MED ORDER — ATORVASTATIN CALCIUM 40 MG PO TABS: 40.0000 mg | ORAL_TABLET | Freq: Every day | ORAL | 6 refills | Status: DC

## 2024-04-25 ENCOUNTER — Other Ambulatory Visit: Payer: Self-pay | Admitting: Nurse Practitioner

## 2024-04-27 NOTE — Telephone Encounter (Signed)
 Patient's mother is calling to check on the status of prescription due to the pharmacy reaching out to her stating they have not heard back regarding prescription. Please advise.

## 2024-05-01 ENCOUNTER — Other Ambulatory Visit: Payer: Self-pay | Admitting: *Deleted

## 2024-05-01 DIAGNOSIS — Z79899 Other long term (current) drug therapy: Secondary | ICD-10-CM

## 2024-05-01 DIAGNOSIS — I2510 Atherosclerotic heart disease of native coronary artery without angina pectoris: Secondary | ICD-10-CM

## 2024-05-01 DIAGNOSIS — E782 Mixed hyperlipidemia: Secondary | ICD-10-CM

## 2024-05-15 DIAGNOSIS — G4733 Obstructive sleep apnea (adult) (pediatric): Secondary | ICD-10-CM | POA: Diagnosis not present

## 2024-05-16 DIAGNOSIS — G4733 Obstructive sleep apnea (adult) (pediatric): Secondary | ICD-10-CM | POA: Diagnosis not present

## 2024-05-27 ENCOUNTER — Other Ambulatory Visit: Payer: Self-pay | Admitting: Cardiology

## 2024-05-29 NOTE — Telephone Encounter (Signed)
 Pt reported he is tolerating Ozempic  1 mg dose well and ready to increase dose to 2 mg once week.

## 2024-06-12 ENCOUNTER — Other Ambulatory Visit (HOSPITAL_COMMUNITY)
Admission: RE | Admit: 2024-06-12 | Discharge: 2024-06-12 | Disposition: A | Discharge status: Home / Self Care | Source: Ambulatory Visit | Attending: Nurse Practitioner | Admitting: Nurse Practitioner

## 2024-06-12 DIAGNOSIS — I2510 Atherosclerotic heart disease of native coronary artery without angina pectoris: Secondary | ICD-10-CM | POA: Insufficient documentation

## 2024-06-12 DIAGNOSIS — Z79899 Other long term (current) drug therapy: Secondary | ICD-10-CM | POA: Diagnosis not present

## 2024-06-12 DIAGNOSIS — E782 Mixed hyperlipidemia: Secondary | ICD-10-CM | POA: Diagnosis not present

## 2024-06-12 LAB — HEPATIC FUNCTION PANEL
ALT: 27 U/L (ref 0–44)
AST: 37 U/L (ref 15–41)
Albumin: 3.8 g/dL (ref 3.5–5.0)
Alkaline Phosphatase: 113 U/L (ref 38–126)
Bilirubin, Direct: 0.1 mg/dL (ref 0.0–0.2)
Indirect Bilirubin: 0.4 mg/dL (ref 0.3–0.9)
Total Bilirubin: 0.5 mg/dL (ref 0.0–1.2)
Total Protein: 7.1 g/dL (ref 6.5–8.1)

## 2024-06-12 LAB — LIPID PANEL
Cholesterol: 123 mg/dL (ref 0–200)
HDL: 51 mg/dL (ref >40)
LDL Cholesterol: 61 mg/dL (ref 0–99)
Total CHOL/HDL Ratio: 2.4 ratio
Triglycerides: 54 mg/dL (ref <150)
VLDL: 11 mg/dL (ref 0–40)

## 2024-06-25 ENCOUNTER — Ambulatory Visit: Payer: Self-pay | Admitting: Nurse Practitioner

## 2024-07-03 DIAGNOSIS — G4733 Obstructive sleep apnea (adult) (pediatric): Secondary | ICD-10-CM | POA: Diagnosis not present

## 2024-08-02 ENCOUNTER — Other Ambulatory Visit: Payer: Self-pay

## 2024-08-02 ENCOUNTER — Telehealth: Payer: Self-pay | Admitting: Nurse Practitioner

## 2024-08-02 MED ORDER — AMLODIPINE BESYLATE 10 MG PO TABS: 10.0000 mg | ORAL_TABLET | Freq: Every day | ORAL | 1 refills | Status: AC

## 2024-08-02 NOTE — Telephone Encounter (Signed)
*  STAT* If patient is at the pharmacy, call can be transferred to refill team.   1. Which medications need to be refilled? (please list name of each medication and dose if known)   amLODipine  (NORVASC ) 10 MG tablet     2. Would you like to learn more about the convenience, safety, & potential cost savings by using the [facility]? No   3. Are you open to using the [facility] (Type [facility]. No    4. Which pharmacy/location (including street and city if local pharmacy) is medication to be sent to?[id number] - [place], [place] - [address]    5. Do they need a 30 day or 90 day supply? 90 day ]

## 2024-08-15 DIAGNOSIS — G4733 Obstructive sleep apnea (adult) (pediatric): Secondary | ICD-10-CM | POA: Diagnosis not present

## 2024-09-21 ENCOUNTER — Encounter: Payer: Self-pay | Admitting: Nurse Practitioner

## 2024-09-21 ENCOUNTER — Other Ambulatory Visit (HOSPITAL_BASED_OUTPATIENT_CLINIC_OR_DEPARTMENT_OTHER): Payer: Self-pay

## 2024-09-21 ENCOUNTER — Ambulatory Visit: Attending: Nurse Practitioner | Admitting: Nurse Practitioner

## 2024-09-21 VITALS — BP 122/78 | HR 92 | Ht 78.0 in | Wt 342.8 lb

## 2024-09-21 DIAGNOSIS — E669 Obesity, unspecified: Secondary | ICD-10-CM

## 2024-09-21 DIAGNOSIS — E785 Hyperlipidemia, unspecified: Secondary | ICD-10-CM

## 2024-09-21 DIAGNOSIS — I10 Essential (primary) hypertension: Secondary | ICD-10-CM

## 2024-09-21 DIAGNOSIS — I2510 Atherosclerotic heart disease of native coronary artery without angina pectoris: Secondary | ICD-10-CM | POA: Diagnosis not present

## 2024-09-21 DIAGNOSIS — I890 Lymphedema, not elsewhere classified: Secondary | ICD-10-CM

## 2024-09-21 DIAGNOSIS — E1140 Type 2 diabetes mellitus with diabetic neuropathy, unspecified: Secondary | ICD-10-CM

## 2024-09-21 NOTE — Progress Notes (Signed)
 Cardiology Office Note:  .   Date:  09/21/2024 ID:  Johnny Hayes, DOB 1971/10/09, MRN [id number] PCP: Johnny Lynwood NOVAK, MD Dr. Verdie -  Berthold Hayes Providers Cardiologist:  Johnny Carrier, MD    History of Present Illness: Johnny   EDILSON Hayes is a 53 y.o. male with a PMH of three vessel CAD, DOE, HTN, HLD, COPD, OSA on BiPAP, T2DM, aortic aneurysm, and lymphedema, who presents today for 6 month follow-up.   Last seen by Dr. Carrier Johnny on August 11, 2023. Was noted pt was having significant DOE with unclear etiology. NM PET scan was normal.   09/23/2023 - Today he presents for follow-up with his family. He states he is doing well. DOE is stable. His mother says he is going to see a pulmonologist soon and also will be getting a lymphedema device set up. Currently working with PT. Denies any chest pain, palpitations, syncope, presyncope, dizziness, orthopnea, PND, swelling or significant weight changes, acute bleeding, or claudication.  03/23/2024 - He presents today for follow-up.  He is accompanied by his mom today during office visit. Doing well and denies any cardiac complaints or issues. He is also due for labs. Mother is wanting to know about a referral to a PCP. Denies any chest pain, shortness of breath, palpitations, syncope, presyncope, dizziness, orthopnea, PND, swelling or significant weight changes, acute bleeding, or claudication. Complaint with lymphedema treatments.   09/21/2024 - Here for follow-up. He has lost 27 lbs since I have last seen him. Doing well. Denies any acute cardiac complaints or issues. Denies any chest pain, shortness of breath, palpitations, syncope, presyncope, dizziness, orthopnea, PND, swelling or significant weight changes, acute bleeding, or claudication.  ROS: Negative. See HPI.  Studies Reviewed: Johnny    EKG: EKG is not ordered today.  NM PET CT cardiac:    The study is normal. The study is low risk.   LV perfusion is normal. There is no  evidence of ischemia.   Rest left ventricular function is normal. Rest EF: 59%. Stress left ventricular function is normal. Stress EF: 63%. End diastolic cavity size is normal. End systolic cavity size is normal.   Myocardial blood flow was computed to be 1.103ml/g/min at rest and 2.11ml/g/min at stress. Global myocardial blood flow reserve was 1.76 and was mildly abnormal. Mildly abnormal myocardial blood flow reserve can likely be explained by high resting blood flow due to high resting rate-pressure product (HR 85, SBP 160, RPP 13600). Correcting for elevated RPP at rest, the MBF reserve is 2.67.   Coronary calcium  was present on the attenuation correction CT images. Moderate coronary calcifications were present. Coronary calcifications were present in the left anterior descending artery, left circumflex artery and right coronary artery distribution(s).      Physical Exam:   VS:  BP 122/78   Pulse 92   Ht 6' 6 (1.981 m)   Wt (!) 342 lb 12.8 oz (155.5 kg)   SpO2 93%   BMI 39.61 kg/m    Wt Readings from Last 3 Encounters:  09/21/24 (!) 342 lb 12.8 oz (155.5 kg)  03/23/24 (!) 369 lb (167.4 kg)  09/24/23 (!) 374 lb (169.6 kg)    GEN: Obese, 53 y.o. male in no acute distress NECK: No JVD; No carotid bruits CARDIAC: S1/S2, RRR, no murmurs, rubs, gallops RESPIRATORY:  Clear to auscultation without rales, wheezing or rhonchi  ABDOMEN: Soft, non-tender, non-distended EXTREMITIES:  lymphedema noted to BLE; No deformity   ASSESSMENT AND  PLAN: .    CAD Stable with no anginal symptoms. No indication for ischemic evaluation. Past cardiac NM PET stress test noted above, study was normal. No indication for further cardiac testing. Continue current medication regimen. Heart healthy diet and regular cardiovascular exercise encouraged. Care and ED precautions discussed.   HTN SBP stable today. SBP goal < 130. Discussed to monitor BP at home at least 2 hours after medications and sitting for 5-10  minutes. No medication changes at this time. Heart healthy diet and regular cardiovascular exercise encouraged. Continue to follow with PCP.  HLD LDL 61 06/2024. Continue current medication regimen. PCP to manage. Continue to follow with PCP.   4. Lymphedema Stable per his report. Continue to follow-up with Lymphedema clinic and PCP. Use device as instructed when available.   5. Obesity, type 2 diabetes Weight loss via diet and exercise encouraged. Discussed the impact being overweight would have on cardiovascular risk. He has lost a considerable amount of weight since I have last seen him. Congratulated him on his weight loss so far. Continue Ozempic .   Dispo: Follow-up with Dr. Dorn Ross or APP in 6 months or sooner if anything changes.  Signed, Johnny Crate, NP

## 2024-09-21 NOTE — Patient Instructions (Signed)
 Medication Instructions:  Continue all current medications.   Labwork: none  Testing/Procedures: none  Follow-Up: 6 months   Any Other Special Instructions Will Be Listed Below (If Applicable).   If you need a refill on your cardiac medications before your next appointment, please call your pharmacy.

## 2024-09-26 ENCOUNTER — Encounter: Payer: Self-pay | Admitting: *Deleted

## 2024-09-26 NOTE — Progress Notes (Signed)
 STEFEN JUBA                                          MRN: [id number]   09/26/2024   The [facility] Quality Team Specialist reviewed this patient medical record for the purposes of chart review for care gap closure. The following were reviewed: abstraction for care gap closure-glycemic status assessment and kidney health evaluation for diabetes:eGFR  and uACR.    [facility] Quality Team

## 2024-10-20 ENCOUNTER — Other Ambulatory Visit: Payer: Self-pay | Admitting: Nurse Practitioner

## 2024-11-27 NOTE — Progress Notes (Signed)
 Johnny Hayes                                          MRN: [id number]   11/27/2024   The [facility] Quality Team Specialist reviewed this patient medical record for the purposes of chart review for care gap closure. The following were reviewed: chart review for care gap closure-glycemic status assessment.    [facility] Quality Team

## 2025-03-22 ENCOUNTER — Ambulatory Visit: Admitting: Nurse Practitioner
